# Patient Record
Sex: Female | Born: 1935
Health system: Southern US, Community
[De-identification: ages and names within clinical notes are randomized; demographics above are authoritative.]

## PROBLEM LIST (undated history)

## (undated) DIAGNOSIS — R112 Nausea with vomiting, unspecified: Secondary | ICD-10-CM

## (undated) DIAGNOSIS — Z9889 Other specified postprocedural states: Secondary | ICD-10-CM

## (undated) DIAGNOSIS — E119 Type 2 diabetes mellitus without complications: Secondary | ICD-10-CM

## (undated) DIAGNOSIS — K5903 Drug induced constipation: Secondary | ICD-10-CM

## (undated) DIAGNOSIS — G4700 Insomnia, unspecified: Secondary | ICD-10-CM

## (undated) DIAGNOSIS — N189 Chronic kidney disease, unspecified: Secondary | ICD-10-CM

## (undated) DIAGNOSIS — M419 Scoliosis, unspecified: Secondary | ICD-10-CM

## (undated) DIAGNOSIS — H269 Unspecified cataract: Secondary | ICD-10-CM

## (undated) DIAGNOSIS — Z8489 Family history of other specified conditions: Secondary | ICD-10-CM

## (undated) DIAGNOSIS — Z973 Presence of spectacles and contact lenses: Secondary | ICD-10-CM

## (undated) DIAGNOSIS — T402X5A Adverse effect of other opioids, initial encounter: Secondary | ICD-10-CM

## (undated) DIAGNOSIS — E039 Hypothyroidism, unspecified: Secondary | ICD-10-CM

## (undated) DIAGNOSIS — M1990 Unspecified osteoarthritis, unspecified site: Secondary | ICD-10-CM

## (undated) DIAGNOSIS — N3281 Overactive bladder: Secondary | ICD-10-CM

## (undated) DIAGNOSIS — C801 Malignant (primary) neoplasm, unspecified: Secondary | ICD-10-CM

## (undated) DIAGNOSIS — J45909 Unspecified asthma, uncomplicated: Secondary | ICD-10-CM

## (undated) HISTORY — PX: TUBAL LIGATION: SHX77

## (undated) HISTORY — PX: BREAST SURGERY: SHX581

## (undated) HISTORY — PX: CHOLECYSTECTOMY: SHX55

## (undated) HISTORY — PX: MASTECTOMY: SHX3

## (undated) HISTORY — DX: Unspecified cataract: H26.9

## (undated) HISTORY — PX: ABDOMINAL HYSTERECTOMY: SHX81

## (undated) HISTORY — PX: APPENDECTOMY: SHX54

## (undated) HISTORY — PX: TONSILLECTOMY: SUR1361

## (undated) HISTORY — PX: CATARACT EXTRACTION W/ INTRAOCULAR LENS  IMPLANT, BILATERAL: SHX1307

## (undated) HISTORY — DX: Chronic kidney disease, unspecified: N18.9

## (undated) HISTORY — PX: COLONOSCOPY: SHX174

---

## 2014-04-19 NOTE — Pre-Procedure Instructions (Addendum)
ADDYSYN FERN  04/19/2014   Your procedure is scheduled on:  Thursday, April 21, 2014  Report to [facility] Admitting at 5:30 AM.  Call this number if you have problems the morning of surgery: [phone]   Remember: Patient to bring brace.   Do not eat food or drink liquids after midnight.   Take these medicines the morning of surgery with A SIP OF WATER: gabapentin (NEURONTIN) levothyroxine (SYNTHROID, LEVOTHROID) , tamoxifen (NOLVADEX), if needed: HYDROcodone-acetaminophen (NORCO/VICODIN)  DO NOT TAKE ANY DIABETIC MEDICATIONS THE MORNING OF PROCEDURE such as glipiZIDE (GLUCOTROL)   Stop taking Aspirin, vitamins and herbal medications. Do not take any NSAIDs ie: Ibuprofen, Advil, Naproxen or any medication containing Aspirin; stop now.   Do not wear jewelry, make-up or nail polish.  Do not wear lotions, powders, or perfumes. You may not wear deodorant.  Do not shave 48 hours prior to surgery.   Do not bring valuables to the hospital.  [facility] is not responsible  for any belongings or valuables.               Contacts, dentures or bridgework may not be worn into surgery.  Leave suitcase in the car. After surgery it may be brought to your room.  For patients admitted to the hospital, discharge time is determined by your treatment team.               Patients discharged the day of surgery will not be allowed to drive home.  Name and phone number of your driver:   Special Instructions:  Special Instructions:Special Instructions: [facility] - Preparing for Surgery  Before surgery, you can play an important role.  Because skin is not sterile, your skin needs to be as free of germs as possible.  You can reduce the number of germs on you skin by washing with CHG (chlorahexidine gluconate) soap before surgery.  CHG is an antiseptic cleaner which kills germs and bonds with the skin to continue killing germs even after washing.  Please DO NOT use if you have an allergy to  CHG or antibacterial soaps.  If your skin becomes reddened/irritated stop using the CHG and inform your nurse when you arrive at Short Stay.  Do not shave (including legs and underarms) for at least 48 hours prior to the first CHG shower.  You may shave your face.  Please follow these instructions carefully:   1.  Shower with CHG Soap the night before surgery and the morning of Surgery.  2.  If you choose to wash your hair, wash your hair first as usual with your normal shampoo.  3.  After you shampoo, rinse your hair and body thoroughly to remove the Shampoo.  4.  Use CHG as you would any other liquid soap.  You can apply chg directly  to the skin and wash gently with scrungie or a clean washcloth.  5.  Apply the CHG Soap to your body ONLY FROM THE NECK DOWN.  Do not use on open wounds or open sores.  Avoid contact with your eyes, ears, mouth and genitals (private parts).  Wash genitals (private parts) with your normal soap.  6.  Wash thoroughly, paying special attention to the area where your surgery will be performed.  7.  Thoroughly rinse your body with warm water from the neck down.  8.  DO NOT shower/wash with your normal soap after using and rinsing off the CHG Soap.  9.  Pat yourself dry  with a clean towel.            10.  Wear clean pajamas.            11.  Place clean sheets on your bed the night of your first shower and do not sleep with pets.  Day of Surgery  Do not apply any lotions/deodorants the morning of surgery.  Please wear clean clothes to the hospital/surgery center.   Please read over the following fact sheets that you were given: Pain Booklet, Coughing and Deep Breathing, Blood Transfusion Information, MRSA Information and Surgical Site Infection Prevention

## 2014-04-20 ENCOUNTER — Encounter (HOSPITAL_COMMUNITY)
Admission: RE | Admit: 2014-04-20 | Discharge: 2014-04-20 | Disposition: A | Discharge status: Home / Self Care | Payer: PPO | Source: Ambulatory Visit | Attending: Orthopedic Surgery | Admitting: Orthopedic Surgery

## 2014-04-20 ENCOUNTER — Encounter (HOSPITAL_COMMUNITY): Payer: Self-pay

## 2014-04-20 HISTORY — DX: Insomnia, unspecified: G47.00

## 2014-04-20 HISTORY — DX: Overactive bladder: N32.81

## 2014-04-20 HISTORY — DX: Unspecified osteoarthritis, unspecified site: M19.90

## 2014-04-20 HISTORY — DX: Scoliosis, unspecified: M41.9

## 2014-04-20 HISTORY — DX: Adverse effect of other opioids, initial encounter: T40.2X5A

## 2014-04-20 HISTORY — DX: Drug induced constipation: K59.03

## 2014-04-20 HISTORY — DX: Presence of spectacles and contact lenses: Z97.3

## 2014-04-20 HISTORY — DX: Type 2 diabetes mellitus without complications: E11.9

## 2014-04-20 HISTORY — DX: Family history of other specified conditions: Z84.89

## 2014-04-20 HISTORY — DX: Other specified postprocedural states: Z98.890

## 2014-04-20 HISTORY — DX: Malignant (primary) neoplasm, unspecified: C80.1

## 2014-04-20 HISTORY — DX: Unspecified asthma, uncomplicated: J45.909

## 2014-04-20 HISTORY — DX: Nausea with vomiting, unspecified: R11.2

## 2014-04-20 HISTORY — DX: Hypothyroidism, unspecified: E03.9

## 2014-04-20 LAB — SURGICAL PCR SCREEN
MRSA, PCR: NEGATIVE
Staphylococcus aureus: POSITIVE — AB

## 2014-04-20 LAB — BASIC METABOLIC PANEL
ANION GAP: 8 (ref 5–15)
BUN: 15 mg/dL (ref 6–23)
CALCIUM: 9.7 mg/dL (ref 8.4–10.5)
CO2: 27 mmol/L (ref 19–32)
CREATININE: 0.93 mg/dL (ref 0.50–1.10)
Chloride: 106 mmol/L (ref 96–112)
GFR calc Af Amer: 66 mL/min — ABNORMAL LOW (ref >90)
GFR calc non Af Amer: 57 mL/min — ABNORMAL LOW (ref >90)
Glucose, Bld: 136 mg/dL — ABNORMAL HIGH (ref 70–99)
Potassium: 4 mmol/L (ref 3.5–5.1)
SODIUM: 141 mmol/L (ref 135–145)

## 2014-04-20 LAB — CBC
HCT: 37.2 % (ref 36.0–46.0)
Hemoglobin: 12.3 g/dL (ref 12.0–15.0)
MCH: 30.1 pg (ref 26.0–34.0)
MCHC: 33.1 g/dL (ref 30.0–36.0)
MCV: 91.2 fL (ref 78.0–100.0)
Platelets: 156 10*3/uL (ref 150–400)
RBC: 4.08 MIL/uL (ref 3.87–5.11)
RDW: 13.2 % (ref 11.5–15.5)
WBC: 5.8 10*3/uL (ref 4.0–10.5)

## 2014-04-20 LAB — TYPE AND SCREEN
ABO/RH(D): A NEG
Antibody Screen: NEGATIVE

## 2014-04-20 LAB — ABO/RH: ABO/RH(D): A NEG

## 2014-04-20 MED ORDER — VANCOMYCIN HCL IN DEXTROSE 1-5 GM/200ML-% IV SOLN
1000.0000 mg | INTRAVENOUS | Status: AC
  Administered 2014-04-21: 1000 mg via INTRAVENOUS
  Filled 2014-04-20: qty 200

## 2014-04-20 NOTE — Progress Notes (Signed)
PCR positive for staph. Called pt and notified her of results. Pt's surgery is at 7:30 AM tomorrow, she agrees to start treatment in the AM.

## 2014-04-20 NOTE — Progress Notes (Signed)
Pt denies SOB, chest pain, and being under the care of a cardiologist. Pt denies having an EKG within the last year. Pt denies having a stress test, echo and cardiac cath.

## 2014-04-21 ENCOUNTER — Inpatient Hospital Stay (HOSPITAL_COMMUNITY): Payer: PPO | Admitting: Anesthesiology

## 2014-04-21 ENCOUNTER — Inpatient Hospital Stay (HOSPITAL_COMMUNITY)
Admission: RE | Admit: 2014-04-21 | Discharge: 2014-04-25 | DRG: 455 | Disposition: A | Discharge status: Home Health | Payer: PPO | Source: Ambulatory Visit | Attending: Orthopedic Surgery | Admitting: Orthopedic Surgery

## 2014-04-21 ENCOUNTER — Encounter (HOSPITAL_COMMUNITY): Payer: Self-pay | Admitting: *Deleted

## 2014-04-21 ENCOUNTER — Encounter (HOSPITAL_COMMUNITY)
Admission: RE | Disposition: A | Discharge status: Home Health | Payer: PPO | Source: Ambulatory Visit | Attending: Orthopedic Surgery

## 2014-04-21 ENCOUNTER — Inpatient Hospital Stay (HOSPITAL_COMMUNITY): Payer: PPO

## 2014-04-21 DIAGNOSIS — E119 Type 2 diabetes mellitus without complications: Secondary | ICD-10-CM | POA: Diagnosis present

## 2014-04-21 DIAGNOSIS — M549 Dorsalgia, unspecified: Secondary | ICD-10-CM | POA: Diagnosis present

## 2014-04-21 DIAGNOSIS — N3020 Other chronic cystitis without hematuria: Secondary | ICD-10-CM | POA: Diagnosis present

## 2014-04-21 DIAGNOSIS — E039 Hypothyroidism, unspecified: Secondary | ICD-10-CM | POA: Diagnosis present

## 2014-04-21 DIAGNOSIS — M4806 Spinal stenosis, lumbar region: Secondary | ICD-10-CM | POA: Diagnosis present

## 2014-04-21 DIAGNOSIS — M4180 Other forms of scoliosis, site unspecified: Secondary | ICD-10-CM | POA: Diagnosis present

## 2014-04-21 DIAGNOSIS — M5489 Other dorsalgia: Secondary | ICD-10-CM | POA: Diagnosis present

## 2014-04-21 DIAGNOSIS — Z981 Arthrodesis status: Secondary | ICD-10-CM

## 2014-04-21 DIAGNOSIS — M810 Age-related osteoporosis without current pathological fracture: Secondary | ICD-10-CM | POA: Diagnosis present

## 2014-04-21 DIAGNOSIS — Z419 Encounter for procedure for purposes other than remedying health state, unspecified: Secondary | ICD-10-CM

## 2014-04-21 HISTORY — PX: ANTERIOR LAT LUMBAR FUSION: SHX1168

## 2014-04-21 LAB — GLUCOSE, CAPILLARY
GLUCOSE-CAPILLARY: 113 mg/dL — AB (ref 70–99)
Glucose-Capillary: 219 mg/dL — ABNORMAL HIGH (ref 70–99)
Glucose-Capillary: 261 mg/dL — ABNORMAL HIGH (ref 70–99)
Glucose-Capillary: 220 mg/dL — ABNORMAL HIGH (ref 70–99)
Glucose-Capillary: 230 mg/dL — ABNORMAL HIGH (ref 70–99)

## 2014-04-21 SURGERY — ANTERIOR LATERAL LUMBAR FUSION 1 LEVEL
Anesthesia: General | Site: Back

## 2014-04-21 MED ORDER — MORPHINE SULFATE 2 MG/ML IJ SOLN
INTRAMUSCULAR | Status: AC
  Administered 2014-04-21: 2 mg via INTRAVENOUS
  Filled 2014-04-21: qty 1

## 2014-04-21 MED ORDER — MUPIROCIN 2 % EX OINT
1.0000 "application " | TOPICAL_OINTMENT | Freq: Two times a day (BID) | CUTANEOUS | Status: DC
  Administered 2014-04-21: 1 via TOPICAL

## 2014-04-21 MED ORDER — MIDAZOLAM HCL 5 MG/5ML IJ SOLN
INTRAMUSCULAR | Status: DC | PRN
  Administered 2014-04-21: 2 mg via INTRAVENOUS

## 2014-04-21 MED ORDER — OXYCODONE HCL 5 MG PO TABS
10.0000 mg | ORAL_TABLET | ORAL | Status: DC | PRN
  Administered 2014-04-21 – 2014-04-25 (×11): 10 mg via ORAL
  Filled 2014-04-21 (×11): qty 2

## 2014-04-21 MED ORDER — OXYCODONE HCL 5 MG PO TABS: 5.0000 mg | ORAL_TABLET | Freq: Once | ORAL | Status: DC | PRN

## 2014-04-21 MED ORDER — FENTANYL CITRATE 0.05 MG/ML IJ SOLN
25.0000 ug | INTRAMUSCULAR | Status: DC | PRN
  Administered 2014-04-21 (×4): 25 ug via INTRAVENOUS

## 2014-04-21 MED ORDER — ONDANSETRON HCL 4 MG/2ML IJ SOLN
INTRAMUSCULAR | Status: DC | PRN
  Administered 2014-04-21: 4 mg via INTRAVENOUS

## 2014-04-21 MED ORDER — PHENOL 1.4 % MT LIQD: 1.0000 | OROMUCOSAL | Status: DC | PRN

## 2014-04-21 MED ORDER — EPHEDRINE SULFATE 50 MG/ML IJ SOLN
INTRAMUSCULAR | Status: DC | PRN
  Administered 2014-04-21 (×2): 10 mg via INTRAVENOUS
  Administered 2014-04-21: 20 mg via INTRAVENOUS

## 2014-04-21 MED ORDER — THROMBIN 20000 UNITS EX KIT
PACK | CUTANEOUS | Status: DC | PRN
  Administered 2014-04-21: 20000 [IU] via TOPICAL

## 2014-04-21 MED ORDER — SODIUM CHLORIDE 0.9 % IJ SOLN
INTRAMUSCULAR | Status: AC
  Filled 2014-04-21: qty 10

## 2014-04-21 MED ORDER — DIPHENHYDRAMINE HCL 50 MG/ML IJ SOLN
INTRAMUSCULAR | Status: DC | PRN
  Administered 2014-04-21: 12.5 mg via INTRAVENOUS

## 2014-04-21 MED ORDER — METHOCARBAMOL 500 MG PO TABS
500.0000 mg | ORAL_TABLET | Freq: Four times a day (QID) | ORAL | Status: DC | PRN
  Administered 2014-04-21 – 2014-04-22 (×3): 500 mg via ORAL
  Filled 2014-04-21 (×3): qty 1

## 2014-04-21 MED ORDER — OXYBUTYNIN CHLORIDE 5 MG PO TABS
5.0000 mg | ORAL_TABLET | Freq: Every day | ORAL | Status: DC
  Administered 2014-04-22 – 2014-04-25 (×4): 5 mg via ORAL
  Filled 2014-04-21 (×4): qty 1

## 2014-04-21 MED ORDER — PROPOFOL 10 MG/ML IV BOLUS
INTRAVENOUS | Status: DC | PRN
  Administered 2014-04-21: 150 mg via INTRAVENOUS

## 2014-04-21 MED ORDER — FENTANYL CITRATE 0.05 MG/ML IJ SOLN
INTRAMUSCULAR | Status: DC | PRN
  Administered 2014-04-21: 50 ug via INTRAVENOUS
  Administered 2014-04-21: 100 ug via INTRAVENOUS
  Administered 2014-04-21 (×2): 50 ug via INTRAVENOUS

## 2014-04-21 MED ORDER — SODIUM CHLORIDE 0.9 % IJ SOLN: 3.0000 mL | INTRAMUSCULAR | Status: DC | PRN

## 2014-04-21 MED ORDER — METHOCARBAMOL 1000 MG/10ML IJ SOLN
500.0000 mg | INTRAVENOUS | Status: AC
  Administered 2014-04-21: 500 mg via INTRAVENOUS
  Filled 2014-04-21: qty 5

## 2014-04-21 MED ORDER — PHENYLEPHRINE 40 MCG/ML (10ML) SYRINGE FOR IV PUSH (FOR BLOOD PRESSURE SUPPORT)
PREFILLED_SYRINGE | INTRAVENOUS | Status: AC
  Filled 2014-04-21: qty 10

## 2014-04-21 MED ORDER — DIPHENHYDRAMINE HCL 50 MG/ML IJ SOLN
INTRAMUSCULAR | Status: AC
  Filled 2014-04-21: qty 1

## 2014-04-21 MED ORDER — LIDOCAINE HCL (CARDIAC) 20 MG/ML IV SOLN
INTRAVENOUS | Status: AC
  Filled 2014-04-21: qty 5

## 2014-04-21 MED ORDER — VANCOMYCIN HCL IN DEXTROSE 1-5 GM/200ML-% IV SOLN
1000.0000 mg | Freq: Once | INTRAVENOUS | Status: AC
  Administered 2014-04-21: 1000 mg via INTRAVENOUS
  Filled 2014-04-21: qty 200

## 2014-04-21 MED ORDER — PROPOFOL 10 MG/ML IV BOLUS
INTRAVENOUS | Status: AC
  Filled 2014-04-21: qty 20

## 2014-04-21 MED ORDER — DEXAMETHASONE 4 MG PO TABS
4.0000 mg | ORAL_TABLET | Freq: Four times a day (QID) | ORAL | Status: DC
  Administered 2014-04-21 – 2014-04-25 (×8): 4 mg via ORAL
  Filled 2014-04-21 (×11): qty 1

## 2014-04-21 MED ORDER — FENTANYL CITRATE 0.05 MG/ML IJ SOLN
INTRAMUSCULAR | Status: AC
  Filled 2014-04-21: qty 2

## 2014-04-21 MED ORDER — ROCURONIUM BROMIDE 100 MG/10ML IV SOLN
INTRAVENOUS | Status: DC | PRN
  Administered 2014-04-21: 30 mg via INTRAVENOUS
  Administered 2014-04-21: 10 mg via INTRAVENOUS

## 2014-04-21 MED ORDER — ONDANSETRON HCL 4 MG/2ML IJ SOLN: 4.0000 mg | Freq: Once | INTRAMUSCULAR | Status: DC | PRN

## 2014-04-21 MED ORDER — ROCURONIUM BROMIDE 50 MG/5ML IV SOLN
INTRAVENOUS | Status: AC
  Filled 2014-04-21: qty 1

## 2014-04-21 MED ORDER — LIDOCAINE HCL (CARDIAC) 20 MG/ML IV SOLN
INTRAVENOUS | Status: DC | PRN
  Administered 2014-04-21: 40 mg via INTRAVENOUS

## 2014-04-21 MED ORDER — HEMOSTATIC AGENTS (NO CHARGE) OPTIME
TOPICAL | Status: DC | PRN
  Administered 2014-04-21 (×2): 1 via TOPICAL

## 2014-04-21 MED ORDER — ACETAMINOPHEN 10 MG/ML IV SOLN
1000.0000 mg | Freq: Four times a day (QID) | INTRAVENOUS | Status: AC
  Administered 2014-04-21 – 2014-04-22 (×4): 1000 mg via INTRAVENOUS
  Filled 2014-04-21 (×4): qty 100

## 2014-04-21 MED ORDER — THROMBIN 20000 UNITS EX SOLR
CUTANEOUS | Status: AC
  Filled 2014-04-21: qty 20000

## 2014-04-21 MED ORDER — PHENYLEPHRINE HCL 10 MG/ML IJ SOLN
INTRAMUSCULAR | Status: AC
  Filled 2014-04-21: qty 1

## 2014-04-21 MED ORDER — LACTATED RINGERS IV SOLN
INTRAVENOUS | Status: DC
Start: 2014-04-21 — End: 2014-04-25
  Administered 2014-04-21: 21:00:00 via INTRAVENOUS

## 2014-04-21 MED ORDER — OXYCODONE HCL 5 MG/5ML PO SOLN: 5.0000 mg | Freq: Once | ORAL | Status: DC | PRN

## 2014-04-21 MED ORDER — SODIUM CHLORIDE 0.9 % IV SOLN: 250.0000 mL | INTRAVENOUS | Status: DC

## 2014-04-21 MED ORDER — ACETAMINOPHEN 10 MG/ML IV SOLN
1000.0000 mg | INTRAVENOUS | Status: AC
  Administered 2014-04-21: 1000 mg via INTRAVENOUS
  Filled 2014-04-21: qty 100

## 2014-04-21 MED ORDER — GLIPIZIDE 5 MG PO TABS
5.0000 mg | ORAL_TABLET | Freq: Every day | ORAL | Status: DC
  Administered 2014-04-22 – 2014-04-25 (×4): 5 mg via ORAL
  Filled 2014-04-21 (×4): qty 1

## 2014-04-21 MED ORDER — EPHEDRINE SULFATE 50 MG/ML IJ SOLN
INTRAMUSCULAR | Status: AC
  Filled 2014-04-21: qty 1

## 2014-04-21 MED ORDER — INSULIN ASPART 100 UNIT/ML ~~LOC~~ SOLN
0.0000 [IU] | SUBCUTANEOUS | Status: DC
  Administered 2014-04-21 (×2): 5 [IU] via SUBCUTANEOUS
  Administered 2014-04-21: 8 [IU] via SUBCUTANEOUS
  Administered 2014-04-22: 5 [IU] via SUBCUTANEOUS
  Administered 2014-04-22 (×3): 3 [IU] via SUBCUTANEOUS
  Administered 2014-04-23 (×2): 5 [IU] via SUBCUTANEOUS
  Administered 2014-04-23 (×2): 3 [IU] via SUBCUTANEOUS
  Administered 2014-04-23: 5 [IU] via SUBCUTANEOUS
  Administered 2014-04-23 – 2014-04-24 (×3): 3 [IU] via SUBCUTANEOUS
  Administered 2014-04-24: 5 [IU] via SUBCUTANEOUS
  Administered 2014-04-24: 3 [IU] via SUBCUTANEOUS
  Administered 2014-04-24: 8 [IU] via SUBCUTANEOUS
  Administered 2014-04-24 – 2014-04-25 (×2): 5 [IU] via SUBCUTANEOUS
  Administered 2014-04-25: 8 [IU] via SUBCUTANEOUS
  Administered 2014-04-25 (×2): 5 [IU] via SUBCUTANEOUS

## 2014-04-21 MED ORDER — DEXAMETHASONE SODIUM PHOSPHATE 4 MG/ML IJ SOLN
4.0000 mg | Freq: Four times a day (QID) | INTRAMUSCULAR | Status: DC
  Administered 2014-04-21 – 2014-04-25 (×5): 4 mg via INTRAVENOUS
  Filled 2014-04-21 (×4): qty 1

## 2014-04-21 MED ORDER — DEXAMETHASONE SODIUM PHOSPHATE 4 MG/ML IJ SOLN
INTRAMUSCULAR | Status: DC | PRN
  Administered 2014-04-21: 4 mg via INTRAVENOUS

## 2014-04-21 MED ORDER — METHOCARBAMOL 1000 MG/10ML IJ SOLN
500.0000 mg | Freq: Four times a day (QID) | INTRAMUSCULAR | Status: DC | PRN
  Administered 2014-04-21: 500 mg via INTRAVENOUS
  Filled 2014-04-21: qty 5

## 2014-04-21 MED ORDER — METOCLOPRAMIDE HCL 5 MG/ML IJ SOLN
INTRAMUSCULAR | Status: DC | PRN
  Administered 2014-04-21: 10 mg via INTRAVENOUS

## 2014-04-21 MED ORDER — SUCCINYLCHOLINE CHLORIDE 20 MG/ML IJ SOLN
INTRAMUSCULAR | Status: DC | PRN
  Administered 2014-04-21: 120 mg via INTRAVENOUS

## 2014-04-21 MED ORDER — LEVOTHYROXINE SODIUM 50 MCG PO TABS
50.0000 ug | ORAL_TABLET | Freq: Every day | ORAL | Status: DC
  Administered 2014-04-22 – 2014-04-25 (×4): 50 ug via ORAL
  Filled 2014-04-21 (×4): qty 1

## 2014-04-21 MED ORDER — PROPOFOL INFUSION 10 MG/ML OPTIME
INTRAVENOUS | Status: DC | PRN
  Administered 2014-04-21: 50 ug/kg/min via INTRAVENOUS

## 2014-04-21 MED ORDER — PROPOFOL 10 MG/ML IV EMUL
INTRAVENOUS | Status: AC
  Filled 2014-04-21: qty 100

## 2014-04-21 MED ORDER — MUPIROCIN 2 % EX OINT
TOPICAL_OINTMENT | CUTANEOUS | Status: AC
Start: 2014-04-21 — End: 2014-04-21
  Administered 2014-04-21: 1 via TOPICAL
  Filled 2014-04-21: qty 22

## 2014-04-21 MED ORDER — SUCCINYLCHOLINE CHLORIDE 20 MG/ML IJ SOLN
INTRAMUSCULAR | Status: AC
  Filled 2014-04-21: qty 1

## 2014-04-21 MED ORDER — PHENYLEPHRINE HCL 10 MG/ML IJ SOLN
10.0000 mg | INTRAVENOUS | Status: DC | PRN
  Administered 2014-04-21: 25 ug/min via INTRAVENOUS

## 2014-04-21 MED ORDER — BUPIVACAINE-EPINEPHRINE (PF) 0.25% -1:200000 IJ SOLN
INTRAMUSCULAR | Status: AC
  Filled 2014-04-21: qty 30

## 2014-04-21 MED ORDER — SODIUM CHLORIDE 0.9 % IJ SOLN
3.0000 mL | Freq: Two times a day (BID) | INTRAMUSCULAR | Status: DC
  Administered 2014-04-22 – 2014-04-25 (×6): 3 mL via INTRAVENOUS

## 2014-04-21 MED ORDER — MORPHINE SULFATE 2 MG/ML IJ SOLN
1.0000 mg | INTRAMUSCULAR | Status: DC | PRN
  Administered 2014-04-21 (×2): 2 mg via INTRAVENOUS
  Filled 2014-04-21: qty 1

## 2014-04-21 MED ORDER — ONDANSETRON HCL 4 MG/2ML IJ SOLN: 4.0000 mg | INTRAMUSCULAR | Status: DC | PRN

## 2014-04-21 MED ORDER — 0.9 % SODIUM CHLORIDE (POUR BTL) OPTIME
TOPICAL | Status: DC | PRN
  Administered 2014-04-21 (×2): 1000 mL

## 2014-04-21 MED ORDER — GABAPENTIN 300 MG PO CAPS
300.0000 mg | ORAL_CAPSULE | Freq: Three times a day (TID) | ORAL | Status: DC
  Administered 2014-04-21 – 2014-04-22 (×2): 300 mg via ORAL
  Administered 2014-04-22 – 2014-04-23 (×5): 600 mg via ORAL
  Administered 2014-04-24 – 2014-04-25 (×4): 300 mg via ORAL
  Filled 2014-04-21: qty 1
  Filled 2014-04-21: qty 2
  Filled 2014-04-21 (×5): qty 1
  Filled 2014-04-21 (×2): qty 2
  Filled 2014-04-21: qty 1
  Filled 2014-04-21: qty 2
  Filled 2014-04-21: qty 1

## 2014-04-21 MED ORDER — METOCLOPRAMIDE HCL 5 MG/ML IJ SOLN
INTRAMUSCULAR | Status: AC
  Filled 2014-04-21: qty 2

## 2014-04-21 MED ORDER — MIDAZOLAM HCL 2 MG/2ML IJ SOLN
INTRAMUSCULAR | Status: AC
  Filled 2014-04-21: qty 2

## 2014-04-21 MED ORDER — LACTATED RINGERS IV SOLN
INTRAVENOUS | Status: DC | PRN
  Administered 2014-04-21 (×3): via INTRAVENOUS

## 2014-04-21 MED ORDER — PHENYLEPHRINE HCL 10 MG/ML IJ SOLN
INTRAMUSCULAR | Status: DC | PRN
  Administered 2014-04-21 (×4): 80 ug via INTRAVENOUS

## 2014-04-21 MED ORDER — MENTHOL 3 MG MT LOZG: 1.0000 | LOZENGE | OROMUCOSAL | Status: DC | PRN

## 2014-04-21 MED ORDER — LISINOPRIL 10 MG PO TABS
10.0000 mg | ORAL_TABLET | Freq: Every day | ORAL | Status: DC
  Administered 2014-04-22 – 2014-04-25 (×4): 10 mg via ORAL
  Filled 2014-04-21 (×4): qty 1

## 2014-04-21 MED ORDER — FENTANYL CITRATE 0.05 MG/ML IJ SOLN
INTRAMUSCULAR | Status: AC
  Filled 2014-04-21: qty 5

## 2014-04-21 SURGICAL SUPPLY — 75 items
BLADE SURG 10 STRL SS (BLADE) ×3 IMPLANT
BLADE SURG ROTATE 9660 (MISCELLANEOUS) IMPLANT
BOLT 5.0X45 SPINAL (Bolt) ×3 IMPLANT
BOLT 5.0X50 SPINAL (Bolt) ×3 IMPLANT
BOLT SPNL LRG 45X5.5XPLAT NS (Screw) ×2 IMPLANT
BONE MATRIX OSTEOCEL PRO MED (Bone Implant) ×6 IMPLANT
CAGE COROENT XL 12X22X45 (Cage) ×2 IMPLANT
CAGE COROENT XL 12X22X45MM (Cage) ×1 IMPLANT
CLOSURE WOUND 1/2 X4 (GAUZE/BANDAGES/DRESSINGS)
CORDS BIPOLAR (ELECTRODE) ×6 IMPLANT
COVER SURGICAL LIGHT HANDLE (MISCELLANEOUS) ×6 IMPLANT
DRAPE C-ARM 42X72 X-RAY (DRAPES) ×6 IMPLANT
DRAPE C-ARMOR (DRAPES) ×6 IMPLANT
DRAPE INCISE IOBAN 66X45 STRL (DRAPES) ×3 IMPLANT
DRAPE ORTHO SPLIT 77X108 STRL (DRAPES) ×4
DRAPE POUCH INSTRU U-SHP 10X18 (DRAPES) ×3 IMPLANT
DRAPE SURG ORHT 6 SPLT 77X108 (DRAPES) ×2 IMPLANT
DRAPE U-SHAPE 47X51 STRL (DRAPES) ×6 IMPLANT
DRSG MEPILEX BORDER 4X4 (GAUZE/BANDAGES/DRESSINGS) ×6 IMPLANT
DRSG MEPILEX BORDER 4X8 (GAUZE/BANDAGES/DRESSINGS) ×6 IMPLANT
DURAPREP 26ML APPLICATOR (WOUND CARE) ×9 IMPLANT
ELECT BLADE 4.0 EZ CLEAN MEGAD (MISCELLANEOUS) ×6
ELECT PENCIL ROCKER SW 15FT (MISCELLANEOUS) ×6 IMPLANT
ELECT REM PT RETURN 9FT ADLT (ELECTROSURGICAL) ×6
ELECTRODE BLDE 4.0 EZ CLN MEGD (MISCELLANEOUS) ×2 IMPLANT
ELECTRODE REM PT RTRN 9FT ADLT (ELECTROSURGICAL) ×2 IMPLANT
GAUZE SPONGE 4X4 16PLY XRAY LF (GAUZE/BANDAGES/DRESSINGS) ×3 IMPLANT
GLOVE BIOGEL PI IND STRL 8 (GLOVE) ×2 IMPLANT
GLOVE BIOGEL PI IND STRL 8.5 (GLOVE) ×2 IMPLANT
GLOVE BIOGEL PI INDICATOR 8 (GLOVE) ×4
GLOVE BIOGEL PI INDICATOR 8.5 (GLOVE) ×4
GLOVE ORTHO TXT STRL SZ7.5 (GLOVE) ×6 IMPLANT
GLOVE SS BIOGEL STRL SZ 8.5 (GLOVE) ×2 IMPLANT
GLOVE SUPERSENSE BIOGEL SZ 8.5 (GLOVE) ×4
GOWN STRL REUS W/ TWL XL LVL3 (GOWN DISPOSABLE) ×2 IMPLANT
GOWN STRL REUS W/TWL 2XL LVL3 (GOWN DISPOSABLE) ×6 IMPLANT
GOWN STRL REUS W/TWL XL LVL3 (GOWN DISPOSABLE) ×4
KIT BASIN OR (CUSTOM PROCEDURE TRAY) ×3 IMPLANT
KIT DILATOR XLIF 5 (KITS) ×1 IMPLANT
KIT NEEDLE NVM5 EMG ELECT (KITS) ×1 IMPLANT
KIT NEEDLE NVM5 EMG ELECTRODE (KITS) ×2
KIT ROOM TURNOVER OR (KITS) ×3 IMPLANT
KIT SURGICAL ACCESS MAXCESS 4 (KITS) ×3 IMPLANT
KIT XLIF (KITS) ×2
MIX DBX 10CC 35% BONE (Bone Implant) ×3 IMPLANT
NEEDLE 22X1 1/2 (OR ONLY) (NEEDLE) ×3 IMPLANT
NEEDLE SPNL 18GX3.5 QUINCKE PK (NEEDLE) ×3 IMPLANT
NS IRRIG 1000ML POUR BTL (IV SOLUTION) ×3 IMPLANT
PACK LAMINECTOMY ORTHO (CUSTOM PROCEDURE TRAY) ×3 IMPLANT
PACK UNIVERSAL I (CUSTOM PROCEDURE TRAY) ×6 IMPLANT
PAD ARMBOARD 7.5X6 YLW CONV (MISCELLANEOUS) ×6 IMPLANT
PIN FIXATION XLIF DECADE (PIN) ×9 IMPLANT
PLATE DECADE XLIF 4HOLE SZ14 (Plate) ×3 IMPLANT
SCREW 45MM (Screw) ×4 IMPLANT
SCREW DECADE 5.5X50 (Screw) ×3 IMPLANT
SPONGE LAP 4X18 X RAY DECT (DISPOSABLE) ×18 IMPLANT
SPONGE SURGIFOAM ABS GEL 100 (HEMOSTASIS) ×3 IMPLANT
STAPLER VISISTAT 35W (STAPLE) ×3 IMPLANT
STRIP CLOSURE SKIN 1/2X4 (GAUZE/BANDAGES/DRESSINGS) IMPLANT
SURGIFLO TRUKIT (HEMOSTASIS) IMPLANT
SUT BONE WAX W31G (SUTURE) ×3 IMPLANT
SUT MON AB 3-0 SH 27 (SUTURE) ×4
SUT MON AB 3-0 SH27 (SUTURE) ×2 IMPLANT
SUT VIC AB 1 CT1 27 (SUTURE) ×4
SUT VIC AB 1 CT1 27XBRD ANBCTR (SUTURE) ×2 IMPLANT
SUT VIC AB 1 CTX 36 (SUTURE) ×4
SUT VIC AB 1 CTX36XBRD ANBCTR (SUTURE) ×2 IMPLANT
SUT VIC AB 2-0 CT1 18 (SUTURE) ×6 IMPLANT
SYR BULB IRRIGATION 50ML (SYRINGE) ×6 IMPLANT
SYR CONTROL 10ML LL (SYRINGE) ×3 IMPLANT
TAPE CLOTH 4X10 WHT NS (GAUZE/BANDAGES/DRESSINGS) ×6 IMPLANT
TOWEL OR 17X24 6PK STRL BLUE (TOWEL DISPOSABLE) ×6 IMPLANT
TOWEL OR 17X26 10 PK STRL BLUE (TOWEL DISPOSABLE) ×6 IMPLANT
TRAY FOLEY CATH 14FR (SET/KITS/TRAYS/PACK) ×3 IMPLANT
WATER STERILE IRR 1000ML POUR (IV SOLUTION) ×6 IMPLANT

## 2014-04-21 NOTE — Transfer of Care (Signed)
Immediate Anesthesia Transfer of Care Note  Patient: Vanessa Atkins  Procedure(s) Performed: Procedure(s): XLIF L2 - L3 WITH LATERAL PLATES/POSTERIOR DECOMPRESSION L2 - L3 FUSION  (N/A)  Patient Location: PACU  Anesthesia Type:General  Level of Consciousness: awake  Airway & Oxygen Therapy: Patient Spontanous Breathing and Patient connected to nasal cannula oxygen  Post-op Assessment: Report given to RN and Post -op Vital signs reviewed and stable  Post vital signs: Reviewed and stable  Last Vitals:  Filed Vitals:   04/21/14 0632  BP: 133/73  Pulse: 65  Temp: 36.7 C  Resp: 18    Complications: No apparent anesthesia complications

## 2014-04-21 NOTE — Anesthesia Preprocedure Evaluation (Signed)
Anesthesia Evaluation  Patient identified by MRN, date of birth, ID band Patient awake    Reviewed: Allergy & Precautions, NPO status , Patient's Chart, lab work & pertinent test results  Airway Mallampati: II  TM Distance: >3 FB Neck ROM: Full    Dental  (+) Teeth Intact, Dental Advisory Given   Pulmonary  breath sounds clear to auscultation        Cardiovascular Rhythm:Regular Rate:Normal     Neuro/Psych    GI/Hepatic   Endo/Other  diabetes  Renal/GU      Musculoskeletal   Abdominal   Peds  Hematology   Anesthesia Other Findings   Reproductive/Obstetrics                             Anesthesia Physical Anesthesia Plan  ASA: III  Anesthesia Plan: General   Post-op Pain Management:    Induction: Intravenous  Airway Management Planned: Oral ETT  Additional Equipment:   Intra-op Plan:   Post-operative Plan: Extubation in OR  Informed Consent: I have reviewed the patients History and Physical, chart, labs and discussed the procedure including the risks, benefits and alternatives for the proposed anesthesia with the patient or authorized representative who has indicated his/her understanding and acceptance.   Dental advisory given  Plan Discussed with: CRNA and Anesthesiologist  Anesthesia Plan Comments: (Type 2 DM H/O R. Breast Ca Obesity  Plan GA with oral ETT  Roberts Gaudy, MD)        Anesthesia Quick Evaluation

## 2014-04-21 NOTE — Anesthesia Postprocedure Evaluation (Signed)
Anesthesia Post Note  Patient: Vanessa Atkins  Procedure(s) Performed: Procedure(s) (LRB): XLIF L2 - L3 WITH LATERAL PLATES/POSTERIOR DECOMPRESSION L2 - L3 FUSION  (N/A)  Anesthesia type: general  Patient location: PACU  Post pain: Pain level controlled  Post assessment: Patient's Cardiovascular Status Stable  Last Vitals:  Filed Vitals:   04/21/14 1600  BP: 123/53  Pulse: 84  Temp: 35.8 C  Resp: 20    Post vital signs: Reviewed and stable  Level of consciousness: sedated  Complications: No apparent anesthesia complications

## 2014-04-21 NOTE — Brief Op Note (Signed)
04/21/2014  1:33 PM  PATIENT:  Emilio Math  80 y.o. female  PRE-OPERATIVE DIAGNOSIS:  degenerative scelosis  POST-OPERATIVE DIAGNOSIS:  degenerative scelosis  PROCEDURE:  Procedure(s): XLIF L2 - L3 WITH LATERAL PLATES/POSTERIOR DECOMPRESSION L2 - L3 FUSION  (N/A)  SURGEON:  Surgeon(s) and Role:    * Melina Schools, MD - Primary  PHYSICIAN ASSISTANT:   ASSISTANTS: none   ANESTHESIA:   general  EBL:  Total I/O In: 2000 [I.V.:2000] Out: 850 [Urine:850]  BLOOD ADMINISTERED:none  DRAINS: none   LOCAL MEDICATIONS USED:  MARCAINE     SPECIMEN:  No Specimen  DISPOSITION OF SPECIMEN:  N/A  COUNTS:  YES  TOURNIQUET:  * No tourniquets in log *  DICTATION: .Other Dictation: Dictation Number [id number]  PLAN OF CARE: Admit to inpatient   PATIENT DISPOSITION:  PACU - hemodynamically stable.

## 2014-04-21 NOTE — Progress Notes (Signed)
ANTIBIOTIC CONSULT NOTE - INITIAL  Pharmacy Consult:  Vancomycin Indication:  Surgical prophylaxis  Allergies  Allergen Reactions  . Procaine Palpitations    Cold sweat  . Clarithromycin     Other reaction(s): Unknown Can't remember reaction  . Ivp Dye [Iodinated Diagnostic Agents]     Other reaction(s): Other Heart races, chills, Fever Palpitations, sweating  . Lactose Intolerance (Gi) Other (See Comments) and Diarrhea    Other reaction(s): Other Gi upset  . Tetanus Toxoids Itching, Swelling and Rash    Other reaction(s): Other Fever, "Tetanus Vaccine"   . Ciprofloxacin Other (See Comments)    Caused pt to faint; cannot take anything in the Cipro family.  . Metronidazole Diarrhea  . Omeprazole Diarrhea  . Tape Other (See Comments)    Rash with adhesive tape; can use paper tape    Patient Measurements: Weight: 208 lb 6 oz (94.518 kg)  Vital Signs: Temp: 97.6 F (36.4 C) (03/31 1535) Temp Source: Oral (03/31 3419) BP: 126/58 mmHg (03/31 1509) Pulse Rate: 87 (03/31 1509) Intake/Output from this shift: Total I/O In: 2350 [I.V.:2350] Out: 1100 [Urine:1000; Blood:100]  Labs:  Recent Labs  04/20/14 1423  WBC 5.8  HGB 12.3  PLT 156  CREATININE 0.93   Estimated Creatinine Clearance: 58.9 mL/min (by C-G formula based on Cr of 0.93). No results for input(s): VANCOTROUGH, VANCOPEAK, VANCORANDOM, GENTTROUGH, GENTPEAK, GENTRANDOM, TOBRATROUGH, TOBRAPEAK, TOBRARND, AMIKACINPEAK, AMIKACINTROU, AMIKACIN in the last 72 hours.   Microbiology: Recent Results (from the past 720 hour(s))  Surgical pcr screen     Status: Abnormal   Collection Time: 04/20/14  2:47 PM  Result Value Ref Range Status   MRSA, PCR NEGATIVE NEGATIVE Final   Staphylococcus aureus POSITIVE (A) NEGATIVE Final    Comment:        The Xpert SA Assay (FDA approved for NASAL specimens in patients over 78 years of age), is one component of a comprehensive surveillance program.  Test performance  has been validated by [facility] for patients greater than or equal to 4 year old. It is not intended to diagnose infection nor to guide or monitor treatment.     Medical History: Past Medical History  Diagnosis Date  . PONV (postoperative nausea and vomiting)   . Family history of adverse reaction to anesthesia     sister PONV  . Insomnia   . Diabetes mellitus without complication   . Hypothyroidism   . Overactive bladder   . Cancer     breast and uterine  . Wears glasses   . Constipation due to opioid therapy   . Bronchial asthma     last bout around 2009  . Arthritis   . Scoliosis     degenerative scoliosis      Assessment: 80 YOF s/p spinal surgery to receive vancomycin x 1 dose post-op for surgical prophylaxis.  Patient does not have a drain and received the pre-op dose around 0730 today.  Pre-op labs reviewed.   Goal of Therapy:  Surgical prophylaxis   Plan:  - Vanc 1gm IV x 1 at [place] will sign off.  Thank you for the consult.    Shealyn Sean D. Mina Marble, PharmD, BCPS Pager:  [phone] 04/21/2014, 4:19 PM

## 2014-04-21 NOTE — H&P (Signed)
History of Present Illness  The patient is a 80 year old female who comes in today for a preoperative History and Physical. The patient is scheduled for a XLIF L2-3 to be performed by Dr. Duane Lope D. Rolena Infante, MD at [facility] on 04-21-14 . Please see the hospital record for complete dictated history and physical.  Allergies  Iodinated Contrast Irregular heart rate. Adhesive Tape Tetanus Toxoid Adsorbed *TOXOIDS* Swelling, Rash. Cipro *FLUOROQUINOLONES* Dizziness.  Social History  Never consumed alcohol 03/15/2014: Never consumed alcohol Marital status married No history of drug/alcohol rehab Number of flights of stairs before winded less than 1 Not under pain contract Living situation live with spouse Tobacco / smoke exposure 03/15/2014: no Tobacco use Never smoker. 03/15/2014 Children 4 Exercise Exercises never; does other Current work status retired  Medication History  Tamoxifen Citrate (20MG  Tablet, Oral) Active. (qd) Lisinopril (10MG  Tablet, Oral) Active. (qd) Oxybutynin Chloride (5MG  Tablet, Oral) Active. (qd) Levothyroxine Sodium (50MCG Tablet, Oral) Active. (qd) Gabapentin (300MG  Tablet, Oral) Active. (1 up to tid of the 300mg  THEN 600mg  qhs) Xanax (2MG  Tablet, Oral) Active. (qhs) Hydrocodone-Acetaminophen (5-325MG  Tablet, Oral) Active. (1-2 q 8hrs) GlipiZIDE ER (5MG  Tablet ER 24HR, Oral) Active. (qd) Medications Reconciled  Past Surgical History  Hysterectomy complete (cancerous) Gallbladder Surgery laporoscopic Rotator Cuff Repair right Tubal Ligation Tonsillectomy Arthroscopy of Knee right Appendectomy Breast Mass; Local Excision right Dilation and Curettage of Uterus - Multiple Cataract Surgery bilateral  Other Problems Osteoporosis Other disease, cancer, significant illness Hypothyroidism Chronic Cystitis Diabetes Mellitus, Type II  Vitals (Robin C. Young; 04/19/2014 4:15 PM) 04/19/2014 4:10 PM Weight: 208.03  lb Height: 67in Body Surface Area: 2.06 m Body Mass Index: 32.58 kg/m  Temp.: 98.52F(Oral)  Pulse: 75 (Regular)  BP: 115/60 (Sitting, Left Arm, Standard)   Physical Exam) General Mental Status -Alert and cooperative.  Chest and Lung Exam Examination of related systems reveals-well-developed, well-nourished and in no acute distress; alert and oriented x 3. Chest and lung exam reveals -non-tender and normal tactile fremitus.  Cardiovascular Cardiovascular examination reveals -on palpation PMI is normal in location and amplitude, no palpable S3 or S4. Normal cardiac borders. and normal pedal pulses bilaterally.  Peripheral Vascular Lower Extremity Palpation - Calf - Bilateral - soft/supple to palpation, appearance is not suggestive of DVT.  Neurologic Motor Strength - 5/5 normal muscle strength - Left Upper Extremity, Right Upper Extremity and Right Lower Extremity. 4/5 reduced muscle strength - Left Lower Extremity. Sensation Lower Extremity - Left - sensation is diminished in the lower extremity and sensation is diminished to light touch in the lower extremity. Right - sensation is intact in the lower extremity. Reflexes Babinski - Bilateral - Babinski not present. Ankle Clonus - Bilateral - Ankle clonus not present. Clonus - Bilateral - clonus not present.  Musculoskeletal Spine/Ribs/Pelvis Lumbosacral Spine - Evaluation of related systems reveals - well developed, well nourished and in no acute distress and alert and oriented X3. Assessment of pain reveals the following findings - The pain is characterized as - severe, burning and constant ache. Location - pain refers to hip on affected side, pain refers laterally to left lower back and pain refers to posterior leg on affected side.   RADIOGRAPHS: X-rays and MRI are reviewed with right lateral bending there is some correction and improvement in her overall curve. The left lateral bending only intensifies  her degenerative scoliotic curve. She has a significant left sided foraminal stenosis and facet arthrosis with degenerative disk disease at L2-3.  Assessment & Plan  Degenerative scoliosis in adult patient (M41.50) Spinal stenosis, lumbar (M48.06) Note:Because of her scoliosis, I do not think an isolated decompression is advisable as the scoliosis would only intensify.  I am recommending an XLIFT procedure to stabilize the joint and a posterior facet resection with instrumented posterior fusion.  Maybe able to use a lateral plate instead of screws - will determine intra-operatively.  I did review the risk with her to include infection, bleeding, nerve damage, death , stroke, paralysis, , failure to heal, ongoing or worse pain, injury to the lumbar plexus, difficulty walking due to hip flexor weakness and need for further surgery.  MRI does show transitional anamtomy - last disc space is S1/S2

## 2014-04-21 NOTE — Progress Notes (Signed)
Sacral foam prophylaxis not applied due to area of surgery/incision.

## 2014-04-22 ENCOUNTER — Inpatient Hospital Stay (HOSPITAL_COMMUNITY): Payer: PPO

## 2014-04-22 LAB — GLUCOSE, CAPILLARY
GLUCOSE-CAPILLARY: 192 mg/dL — AB (ref 70–99)
GLUCOSE-CAPILLARY: 211 mg/dL — AB (ref 70–99)
GLUCOSE-CAPILLARY: 237 mg/dL — AB (ref 70–99)
Glucose-Capillary: 208 mg/dL — ABNORMAL HIGH (ref 70–99)
Glucose-Capillary: 196 mg/dL — ABNORMAL HIGH (ref 70–99)
Glucose-Capillary: 187 mg/dL — ABNORMAL HIGH (ref 70–99)

## 2014-04-22 MED ORDER — MAGNESIUM CITRATE PO SOLN
1.0000 | Freq: Once | ORAL | Status: AC
  Administered 2014-04-22: 1 via ORAL
  Filled 2014-04-22: qty 296

## 2014-04-22 MED FILL — Thrombin For Soln 20000 Unit: CUTANEOUS | Qty: 1 | Status: AC

## 2014-04-22 NOTE — Progress Notes (Signed)
Inpatient Diabetes Program Recommendations  AACE/ADA: New Consensus Statement on Inpatient Glycemic Control (2013)  Target Ranges:  Prepandial:   less than 140 mg/dL      Peak postprandial:   less than 180 mg/dL (1-2 hours)      Critically ill patients:  140 - 180 mg/dL   Results for ANNALINA, NEEDLES (MRN [id number]) as of 04/22/2014 12:06  Ref. Range 04/21/2014 20:32 04/21/2014 23:15 04/22/2014 04:45 04/22/2014 08:07 04/22/2014 11:32  Glucose-Capillary Latest Range: 70-99 mg/dL 220 (H) 230 (H) 208 (H) 196 (H) 187 (H)     Reason for assessment: elevated CBG  Diabetes history: Type 2 Outpatient Diabetes medications: Glipizide 5mg /day Current orders for Inpatient glycemic control: Novolog correction 0-15 units tid, Glipizide 5mg /day  Please considering discontinuing the Glipizide while the patient is in the hospital and starting Lantus 10 units q day(0.11u/kg) .  When the patient begins to eat, consider switching her Novolog moderate correction to tid and hs.   Gentry Fitz, RN, BA, MHA, CDE Diabetes Coordinator Inpatient Diabetes Program  [phone] (Team Pager) [phone] [facility]) 04/22/2014 12:11 PM

## 2014-04-22 NOTE — Progress Notes (Signed)
Utilization review completed. Gurnoor Sloop, RN, BSN. 

## 2014-04-22 NOTE — Evaluation (Signed)
Occupational Therapy Evaluation Patient Details Name: Vanessa Atkins MRN: [id number] DOB: 1934/10/10 Today's Date: 04/22/2014    History of Present Illness 80 yo female s/p XLIF L2-3 with lateral plates / posterior decompression L2-3 fusion. PMH: R RTR, Cataract surg bil, R arthroscopy to knee, Breast CA   Clinical Impression   Patient is s/p XLIF L2-3 surgery resulting in functional limitations due to the deficits listed below (see OT problem list). Pt reports 8 out 10 pain supine and decreased to 6 out 10 at EOB dangling sitting. Pt awaiting brace at this time. OT to further assess d/c plan next session. Patient will benefit from skilled OT acutely to increase independence and safety with ADLS to allow discharge SNF ( pending progress with brace). Pt was unable to complete bed mobility without HOB raised and (A).     Follow Up Recommendations  SNF (to be further assessed)    Equipment Recommendations  Other (comment) (to be further assessed)    Recommendations for Other Services       Precautions / Restrictions Precautions Precautions: Back;Fall Precaution Comments: back handout provided Required Braces or Orthoses: Spinal Brace Spinal Brace: Lumbar corset;Applied in sitting position      Mobility Bed Mobility Overal bed mobility: Needs Assistance Bed Mobility: Supine to Sit;Sit to Supine     Supine to sit: Mod assist;HOB elevated Sit to supine: Min assist   General bed mobility comments: pt requires mOD v/c for sequence and following commands.pt needed total +2 for repositioning toward [facility]  Transfers                 General transfer comment: not assessed due to no brace    Balance Overall balance assessment: Needs assistance Sitting-balance support: Bilateral upper extremity supported;Feet supported Sitting balance-Leahy Scale: Fair                                      ADL Overall ADL's : Needs assistance/impaired Eating/Feeding:  Independent   Grooming: Wash/dry hands;Wash/dry face;Oral care;Modified independent   Upper Body Bathing: Minimal assitance   Lower Body Bathing: Total assistance                         General ADL Comments: Pt educated on back precautions , provided handout and able to complete teach back. Pt completed bed mobility and return to supine this session. Pt reports decr pain after mobility. Pt awaiting back brace at this time. OT to address LB dressing/ bathing next session and basic transfer. pt wants to d/c home but could require SNF depending on progression.      Vision     Perception     Praxis      Pertinent Vitals/Pain Pain Assessment: 0-10 Pain Score: 8  Pain Location: back Pain Descriptors / Indicators: Constant Pain Intervention(s): Monitored during session     Hand Dominance Right   Extremity/Trunk Assessment Upper Extremity Assessment Upper Extremity Assessment: RUE deficits/detail;LUE deficits/detail RUE Deficits / Details: able to perform 90 degree flexion not assessed past due to back precautions. Pt reports deficits LUE Deficits / Details: able to perform 90 degree flexion not assessed past due to back precautions. Pt reports deficits   Lower Extremity Assessment Lower Extremity Assessment: Defer to PT evaluation   Cervical / Trunk Assessment Cervical / Trunk Assessment: Other exceptions (surg )   Communication Communication Communication: No difficulties  Cognition Arousal/Alertness: Awake/alert Behavior During Therapy: WFL for tasks assessed/performed Overall Cognitive Status: Within Functional Limits for tasks assessed                     General Comments       Exercises       Shoulder Instructions      Home Living Family/patient expects to be discharged to:: Private residence Living Arrangements: Spouse/significant other Available Help at Discharge: Family;Available 24 hours/day Type of Home: House Home Access: Stairs to  enter [employer] of Steps: 2   Home Layout: One level;Other (Comment) (sunken den area)     Bathroom Shower/Tub: Tub/shower unit;Curtain   Bathroom Toilet: Handicapped height     Home Equipment: Environmental consultant - 2 wheels;Grab bars - tub/shower;Adaptive equipment Adaptive Equipment: Reacher Additional Comments: has two small dogs at home      Prior Functioning/Environment Level of Independence: Independent with assistive device(s)             OT Diagnosis: Generalized weakness;Acute pain   OT Problem List: Decreased strength;Decreased activity tolerance;Impaired balance (sitting and/or standing);Decreased safety awareness;Decreased knowledge of use of DME or AE;Decreased knowledge of precautions;Pain   OT Treatment/Interventions: Self-care/ADL training;Therapeutic exercise;Therapeutic activities;Patient/family education;Balance training;DME and/or AE instruction    OT Goals(Current goals can be found in the care plan section) Acute Rehab OT Goals Patient Stated Goal: to go home to her dogs OT Goal Formulation: With patient Time For Goal Achievement: 05/06/14 Potential to Achieve Goals: Good  OT Frequency: Min 2X/week   Barriers to D/C:            Co-evaluation              End of Session Nurse Communication: Mobility status;Precautions  Activity Tolerance: Patient limited by pain Patient left: in bed;with call bell/phone within reach;Other (comment) (remained in bed due to no back brace)   Time: 0277-4128 OT Time Calculation (min): 36 min Charges:  OT General Charges $OT Visit: 1 Procedure OT Evaluation $Initial OT Evaluation Tier I: 1 Procedure OT Treatments $Self Care/Home Management : 8-22 mins G-Codes:    Peri Maris 2014-05-19, 8:44 AM Pager: [phone]

## 2014-04-22 NOTE — Evaluation (Signed)
Physical Therapy Evaluation Patient Details Name: Vanessa Atkins MRN: [id number] DOB: 1934-04-02 Today's Date: 04/22/2014   History of Present Illness  80 yo female s/p XLIF L2-3 with lateral plates / posterior decompression L2-3 fusion. PMH: R RTR, Cataract surg bil, R arthroscopy to knee, Breast CA  Clinical Impression  Pt is able to get up OOB to chair with min mod assist and RW.  She will need to be able to progress gait into the hallway and ascend/descend 2 steps for home entry.  She has 24/7 assist from her husband, but he has his own health issues and cannot likely provide more than min guard assist at discharge.   PT to follow acutely for deficits listed below.       Follow Up Recommendations Home health PT;Supervision/Assistance - 24 hour    Equipment Recommendations  None recommended by PT    Recommendations for Other Services   NA    Precautions / Restrictions Precautions Precautions: Back;Fall Precaution Booklet Issued: Yes (comment) (by OT) Precaution Comments: PT verbally reviewed back precautions brace application and use.  Required Braces or Orthoses: Spinal Brace Spinal Brace: Lumbar corset;Applied in sitting position      Mobility  Bed Mobility Overal bed mobility: Needs Assistance Bed Mobility: Rolling;Sidelying to Sit Rolling: Mod assist Sidelying to sit: Min assist       General bed mobility comments: Mod assist to support trunk and hips to get all the way to side lying.  Pt able to help and pull with bil upper extremities on railing, but needed help controling her hips. Side to sit went better, but still heavy reliance on bed railing for leverag and therapist helping to progress legs over EOB.   Transfers Overall transfer level: Needs assistance   Transfers: Sit to/from Stand;Stand Pivot Transfers Sit to Stand: Min assist;From elevated surface Stand pivot transfers: Min assist;From elevated surface       General transfer comment: Min assist to  support trunk while turning and stepping with RW over weak legs.   Ambulation/Gait Ambulation/Gait assistance: Min assist Ambulation Distance (Feet): 5 Feet Assistive device: Rolling walker (2 wheeled) Gait Pattern/deviations: Step-through pattern;Shuffle     General Gait Details: Pt needs verbal cues for upright posture, assist to support trunk over weak legs during short distance gait.        Balance Overall balance assessment: Needs assistance Sitting-balance support: Feet supported;Bilateral upper extremity supported Sitting balance-Leahy Scale: Fair     Standing balance support: Bilateral upper extremity supported Standing balance-Leahy Scale: Fair Standing balance comment: able to statically stand and remove hands to assist in peri care and pulling up of adult undergarment.                              Pertinent Vitals/Pain Pain Assessment: 0-10 Pain Score: 7  Pain Location: low back Pain Descriptors / Indicators: Aching;Burning Pain Intervention(s): Limited activity within patient's tolerance;Monitored during session;Repositioned    Home Living Family/patient expects to be discharged to:: Private residence Living Arrangements: Spouse/significant other Available Help at Discharge: Family;Available 24 hours/day Type of Home: House Home Access: Stairs to enter Entrance Stairs-Rails: None (post and a recycle container that she pushes up on normally) Entrance Stairs-Number of Steps: 2 Home Layout: One level;Other (Comment) (sunken den area) Home Equipment: Gilford Rile - 2 wheels;Grab bars - tub/shower;Adaptive equipment;Walker - 4 wheels Additional Comments: has two small dogs at home    Prior Function Level of Independence: Independent  with assistive device(s)         Comments: used RWs all the time.      Hand Dominance   Dominant Hand: Right    Extremity/Trunk Assessment   Upper Extremity Assessment: Defer to OT evaluation           Lower  Extremity Assessment: Generalized weakness (per functional assessment, no obvious asymmetries/drag/buckl)         Communication   Communication: No difficulties  Cognition Arousal/Alertness: Awake/alert Behavior During Therapy: WFL for tasks assessed/performed Overall Cognitive Status: Within Functional Limits for tasks assessed                               Assessment/Plan    PT Assessment Patient needs continued PT services  PT Diagnosis Difficulty walking;Abnormality of gait;Acute pain;Generalized weakness   PT Problem List Decreased strength;Decreased activity tolerance;Decreased balance;Decreased mobility;Decreased knowledge of use of DME;Decreased knowledge of precautions;Pain  PT Treatment Interventions DME instruction;Gait training;Stair training;Functional mobility training;Therapeutic activities;Therapeutic exercise;Balance training;Neuromuscular re-education;Patient/family education;Modalities   PT Goals (Current goals can be found in the Care Plan section) Acute Rehab PT Goals Patient Stated Goal: to go home PT Goal Formulation: With patient Time For Goal Achievement: 04/29/14 Potential to Achieve Goals: Good    Frequency Min 5X/week   Barriers to discharge Decreased caregiver support Pt reports husband has his own health issues, but "he is still strong"       End of Session Equipment Utilized During Treatment: Back brace Activity Tolerance: Patient limited by pain Patient left: in chair;with call bell/phone within reach Nurse Communication: Mobility status         Time: 2641-5830 PT Time Calculation (min) (ACUTE ONLY): 43 min   Charges:   PT Evaluation $Initial PT Evaluation Tier I: 1 Procedure PT Treatments $Therapeutic Activity: 8-22 mins $Self Care/Home Management: 8-22        Shya Kovatch B. [place], [place], DPT [phone]   04/22/2014, 4:01 PM

## 2014-04-22 NOTE — Op Note (Signed)
NAMEELENY, CORTEZ NO.:  [id number]  MEDICAL RECORD NO.:  [id number]  LOCATION:  [id number]                        FACILITY:  [facility]  PHYSICIAN:  Dahlia Bailiff, MD    DATE OF BIRTH:  [birth year]  DATE OF PROCEDURE:  04/21/2014 DATE OF DISCHARGE:                              OPERATIVE REPORT   PREOPERATIVE DIAGNOSIS:  Degenerative spinal stenosis with underlying degenerative scoliosis.  POSTOPERATIVE DIAGNOSIS:  Degenerative spinal stenosis with underlying degenerative scoliosis.  OPERATIVE PROCEDURES: 1. Lateral interbody diskectomy with insertion of intervertebral     biomechanical device. 2. Lateral instrumented fusion segmental, L2-3 with lateral plate. 3. Posterior decompression, L2-3 with Gill decompression L2-3 with in     situ fusion, posterolateral arthrodesis.  COMPLICATIONS:  None.  CONDITION:  Stable.  HISTORY:  This is a very pleasant 80 year old woman who has been having severe debilitating back, buttock, and neuropathic left leg pain. Despite exhaustive conservative care, she continued to have pain and loss of quality of life.  As a result, we elected to proceed with surgery.  We discussed all appropriate risks, benefits, and alternatives to surgery.  INSTRUMENTATION SYSTEM USED:  NuVasive PEEK lateral interbody cage, size 12 x 22 parallel packed with Osteocel with a 14 mm 4-hole lateral plate with 50 mm screws in L3 and 45 mm screws in L2.  OPERATIVE NOTE:  The patient was brought to the operating room, placed supine on the operating table.  After successful induction of general anesthesia and endotracheal intubation, TEDs, SCDs, and Foley were inserted.  Needles for intraoperative neuro monitoring were placed.  The patient was then turned into the lateral decubitus position, right side down.  Axillary roll was placed.  All bony prominences were padded.  The arms were placed in individual arm holders.  At this point, x-ray was brought  into the OR, and the patient was properly positioned so that I could visualize the L2-3 disk space.  The patient does have transitional anatomy and so the last complete disk space was read as L5-S1, and then there was a rudimentary disk, which was considered S1-S2.  I confirmed comparing the x-rays with MRI that I was at the appropriate level.  Once I realized I could visualize the appropriate level and she was well- positioned, she was taped down to the bed.  Once she was secured to the bed, the bed was broken and she was tilted down.  This provided better correction of her scoliosis in the right lateral bend position.  Once this was completed, I prepped and draped the left flank.  Time-out was then taken to confirm the patient, procedure, and all other pertinent important data.  Once this was done, I identified the posterior and anterior margins of the L2-3 disk space and infiltrated this proposed incision site with 0.25% Marcaine with epi.  I then made an incision and dissected down to the muscle fibers of the external oblique.  I then made a second incision about the size of my finger in the posterolateral aspect and bluntly dissected down to the posterior aspect of the retroperitoneum.  I then used a Claiborne Billings to enter into the retroperitoneum and then placed my finger into the  retroperitoneum.  This allowed me to mobilize the adipose tissue and bluntly dissect.  I then brought my finger to the undersurface of the oblique and then dissected through the oblique until I could see my finger.  I then placed the first trocar along my finger and brought it down to the L2-3 disk space.  Once I was properly positioned and I confirmed this with the AP and lateral x-rays, I then placed the guide pin through this trocar to keep it in place.  I then stimulated the trocar and moved in a circumferential fashion confirming I was not irritating the plexus.  I then sequentially dilated to the final  dilator again with each dilator stimulating to ensure there was no abnormal neural conduction.  Once the final dilator was in place, I then placed the retracting blades into the wound.  The retractor was secured to the bed.  Once I confirmed a satisfactory position of the retractor in both the AP and lateral planes, I stimulated along the undersurface of each of the blades to confirm that they were not compressing the plexus.  I then placed the Shim blade down the posterior blade so it would secure into the disk space.  Once this was done, I then pushed the blades forward until I was at the level of the anterior longitudinal ligament.  At this point, I had an excellent view of the lateral aspect of the L2-3 disk.  Before incising, I did check one last time counting up from the L5 level in the lateral plane to confirm that I was at the L2-3 disk space.  Once this was confirmed, I incised the disk with a #10 blade scalpel, then using a combination of pituitary rongeurs, curettes, and Kerrison rongeurs, I removed all of the disk material.  I then used a Cobb curette to scrape along the endplates of L2 and L3 and released the contralateral annulus.  Once that was done, I then trialed sequentially and elected to use the size 12 parallel spacer as it provided the best fit and provided excellent restoration of disk space height as well as foraminal volume.  I then at this point used the rasp to ensure I had bleeding subchondral bone.  I then obtained a 12 x 22 parallel cage packed with DBX mix.  It was then [facility] to the appropriate position.  Once it was secured into place, I then obtained the size 14 lateral plate.  This was a 4-hole plate.  I removed the overhanging osteophytes that were remaining at the L3 level, which allowed the plate to fit down.  Once the plate was properly situated, I then secured it with temporary pins into the body of L2 and L3.  I then used the awl to create the  screw hole and then placed the appropriate size screws.  All 4 screws had excellent purchase.  Once they were secured down, I then used a torquing device to lock them in place.  I then torqued the plate as well.  I then irrigated the wound copiously with normal saline, obtained hemostasis using bipolar electrocautery and FloSeal.  I then removed the retracting device and irrigated the wound again copiously with normal saline.  I closed the muscle defect in the oblique with interrupted #1 Vicryl sutures, superficial with 2-0 Vicryl sutures, and 3-0 Monocryl for the skin.  I washed out the second incision with irrigation and closed the deep layer with 0-Vicryl suture and then superficial with 2-0 Vicryl sutures,  and 3-0 Monocryl for the skin.  X-rays at this time were satisfactory.  There was actual improvement in her scoliotic curve and excellent restoration of disk space height at the level of L2-3.  At this point, the second table was brought in, which was the [place] table with a Wilson frame.  The patient was then turned prone onto the Wilson frame.  All bony prominences again were well padded and the back was prepped and draped in a standard fashion.  A second time-out was taken again confirming the posterior portion of the procedure, which would be an L2-3 Gill decompression with in situ fusion.  Once this was done, I then made an incision centered over the L2-3 disk space.  Sharp dissection was carried out down to the deep fascia.  I exposed the L1-2 and L2-3 facet complexes as well as the L2 and L3 transverse processes.  This was accomplished using Bovie and Cobb elevator.  Once the posterior approach was completed and I had the posterolateral gutter exposed, I then continued with my decompression. I confirmed once again with AP and lateral fluoro that I was at the appropriate level and then using a double-action Leksell rongeur, I removed the bulk of the L2 spinous process.  I  then developed a plane underneath the lamina of L2 and performed a generous laminotomy of L2. This allowed me to dissect in the central raphe between the ligamentum flavum and the thecal sac.  I removed the majority of the ligamentum flavum in the central region.  I then undercut the superior leading edge of the L3 lamina.  With the central decompression complete, I then continued out into the lateral gutter.  Since the left side was the worst where the neuropathic pain was, I removed the entire L2 inferior facet.  Once this was done, I was able to decompress into the lateral recess.  I also performed a foraminotomy.  At this point, I knew that there was excellent room for the L2 nerve root not only from the foraminotomy from the XLIF procedure that was done induced with indirect decompression.  I then went down on the right side, decompressing the lateral recess and again checking the foramen and the lateral recess to ensure there was adequate decompression.  Once this was done, I used a high-speed bur and decorticated the transverse processes of L2 and L3 and removed the entire remaining facet capsule on the right side at L2- 3.  I then packed the posterolateral gutter with bone graft that I had harvested from the decompression along with DBX mix and the remaining portion of Osteocel.  The wound was irrigated copiously with normal saline.  I made sure hemostasis using bipolar electrocautery and FloSeal.  A thrombin-soaked Gelfoam patty was placed over the thecal sac, and then I closed this with interrupted #1 Vicryl sutures for the deep fascia, 2-0 Vicryl sutures for the superficial, and 3-0 Monocryl for the skin.  Steri-Strips and dry dressings were applied.  The patient was ultimately extubated and transferred to the PACU without incident. At the end of the case, all needle and sponge counts were correct. There were no adverse intraoperative events.     Dahlia Bailiff,  MD     DDB/MEDQ  D:  04/21/2014  T:  04/22/2014  Job:  [id number]

## 2014-04-22 NOTE — Progress Notes (Signed)
PT Cancellation Note  Patient Details Name: Vanessa Atkins MRN: [id number] DOB: 05/30/1934   Cancelled Treatment:    Reason Eval/Treat Not Completed: Other (comment) (no brace yet).  PT will check back this PM for brace.  See OT note for EOB details.  Thanks,    Barbarann Ehlers. [place], [place], DPT [phone]   04/22/2014, 10:51 AM

## 2014-04-22 NOTE — Progress Notes (Signed)
    Subjective: Procedure(s) (LRB): XLIF L2 - L3 WITH LATERAL PLATES/POSTERIOR DECOMPRESSION L2 - L3 FUSION  (N/A) 1 Day Post-Op  Patient reports pain as 5 on 0-10 scale.  Reports left groin / leg pain.   reports incisional back pain   Foley in place Negative bowel movement Negative flatus Negative chest pain or shortness of breath  Objective: Vital signs in last 24 hours: Temp:  [96.5 F (35.8 C)-98.8 F (37.1 C)] 98.8 F (37.1 C) (04/01 0515) Pulse Rate:  [80-105] 105 (04/01 0515) Resp:  [16-34] 17 (04/01 0515) BP: (102-146)/(48-64) 105/58 mmHg (04/01 0515) SpO2:  [93 %-100 %] 98 % (04/01 0515)  Intake/Output from previous day: 03/31 0701 - 04/01 0700 In: 2470 [P.O.:120; I.V.:2350] Out: 1900 [Urine:1800; Blood:100]  Labs:  Recent Labs  04/20/14 1423  WBC 5.8  RBC 4.08  HCT 37.2  PLT 156    Recent Labs  04/20/14 1423  NA 141  K 4.0  CL 106  CO2 27  BUN 15  CREATININE 0.93  GLUCOSE 136*  CALCIUM 9.7   No results for input(s): LABPT, INR in the last 72 hours.  Physical Exam: Neurologically intact ABD soft Intact pulses distally Dorsiflexion/Plantar flexion intact Incision: dressing C/D/I Compartment soft  Assessment/Plan: Patient stable  xrays satisfactory.  Improved alignment in both planes.  Hardware satisfactory position Continue mobilization with physical therapy Continue care  Advance diet Up with therapy D/C IV fluids  Plan on d/c Sunday or Monday depending upon progress with therapy May require SNF or CIR   Melina Schools, MD [place] [phone]

## 2014-04-23 LAB — GLUCOSE, CAPILLARY
GLUCOSE-CAPILLARY: 189 mg/dL — AB (ref 70–99)
GLUCOSE-CAPILLARY: 168 mg/dL — AB (ref 70–99)
Glucose-Capillary: 248 mg/dL — ABNORMAL HIGH (ref 70–99)
Glucose-Capillary: 205 mg/dL — ABNORMAL HIGH (ref 70–99)
Glucose-Capillary: 185 mg/dL — ABNORMAL HIGH (ref 70–99)

## 2014-04-23 LAB — HEMOGLOBIN A1C
HEMOGLOBIN A1C: 6.9 % — AB (ref 4.8–5.6)
MEAN PLASMA GLUCOSE: 151 mg/dL

## 2014-04-23 MED ORDER — DEXTROSE 5 % IV SOLN
500.0000 mg | Freq: Four times a day (QID) | INTRAVENOUS | Status: DC | PRN
Start: 2014-04-23 — End: 2014-04-25

## 2014-04-23 MED ORDER — ALPRAZOLAM 0.5 MG PO TABS
2.0000 mg | ORAL_TABLET | Freq: Every evening | ORAL | Status: DC | PRN
  Administered 2014-04-24 (×2): 2 mg via ORAL
  Filled 2014-04-23 (×2): qty 4

## 2014-04-23 MED ORDER — METHOCARBAMOL 500 MG PO TABS
500.0000 mg | ORAL_TABLET | Freq: Four times a day (QID) | ORAL | Status: DC | PRN
Start: 2014-04-23 — End: 2014-04-25
  Administered 2014-04-25: 500 mg via ORAL
  Filled 2014-04-23: qty 1

## 2014-04-23 NOTE — Progress Notes (Signed)
Physical Therapy Treatment Patient Details Name: Vanessa Atkins MRN: [id number] DOB: January 14, 1935 Today's Date: 04/23/2014    History of Present Illness 80 yo female s/p XLIF L2-3 with lateral plates / posterior decompression L2-3 fusion. PMH: R RTR, Cataract surg bil, R arthroscopy to knee, Breast CA    PT Comments    Pt is making progress, albeit slowly to ambulate into the hallway today.  She requires min assist overall, but has limited endurance and relies heavily on outside supports (bed rails, RW) for stability during mobility.  She has limited activity tolerance and limited sitting tolerance due to pain right now.  She lives with an elderly husband who will likely only be able to provide supervision at discharge.  She would benefit from a CIR consult/stay prior to d/c to help increase her independence and safety for return home.  RN made aware of recommendation.    Follow Up Recommendations  CIR     Equipment Recommendations  None recommended by PT    Recommendations for Other Services Rehab consult     Precautions / Restrictions Precautions Precautions: Back;Fall Precaution Booklet Issued: Yes (comment) Precaution Comments: Pt able to report 1/3 back precautions unassisted.   Required Braces or Orthoses: Spinal Brace;Other Brace/Splint Spinal Brace: Lumbar corset;Applied in sitting position Other Brace/Splint: per pt MD said it was ok to take brace off once seated and for trips to the bathroom.     Mobility  Bed Mobility Overal bed mobility: Needs Assistance Bed Mobility: Rolling;Sidelying to Sit Rolling: Min assist Sidelying to sit: Min assist       General bed mobility comments: Min assist to support trunk and hips to get to side lying.  Verbal cues for log roll technique.  Pt relying heavily on hands to push up from bed rail for leverage to get to sitting.   Transfers Overall transfer level: Needs assistance Equipment used: Rolling walker (2 wheeled) Transfers:  Sit to/from Stand Sit to Stand: Min assist         General transfer comment: Min assist to support trunk during transitions.  Verbal cues for safe hand placement and to stabilize RW and pt during transition of hands.  Pt stood over flexed knees and very slowly.   Ambulation/Gait Ambulation/Gait assistance: Min assist Ambulation Distance (Feet): 40 Feet Assistive device: Rolling walker (2 wheeled) Gait Pattern/deviations: Step-through pattern;Shuffle Gait velocity: decreased Gait velocity interpretation: Below normal speed for age/gender General Gait Details: Verbal cues for upright posture, pt using safe RW technique. Assist needed at trunk to help support pt over weak legs and painful left leg during gait.           Balance Overall balance assessment: Needs assistance Sitting-balance support: Feet supported;No upper extremity supported Sitting balance-Leahy Scale: Fair     Standing balance support: No upper extremity supported;Bilateral upper extremity supported;Single extremity supported Standing balance-Leahy Scale: Fair                      Cognition Arousal/Alertness: Awake/alert Behavior During Therapy: WFL for tasks assessed/performed Overall Cognitive Status: Within Functional Limits for tasks assessed                             Pertinent Vitals/Pain Pain Assessment: 0-10 Pain Score: 7  Pain Location: low back and left thigh Pain Descriptors / Indicators: Aching;Burning Pain Intervention(s): Monitored during session;Limited activity within patient's tolerance;Repositioned     PT Goals (current goals can  now be found in the care plan section) Acute Rehab PT Goals Patient Stated Goal: to go home Progress towards PT goals: Progressing toward goals    Frequency  Min 5X/week    PT Plan Current plan remains appropriate       End of Session Equipment Utilized During Treatment: Back brace Activity Tolerance: Patient limited by  pain;Patient limited by fatigue Patient left: in chair;with call bell/phone within reach     Time: 1016-1040 PT Time Calculation (min) (ACUTE ONLY): 24 min  Charges:  $Gait Training: 8-22 mins $Therapeutic Activity: 8-22 mins                      Skylene Deremer B. [place], [place], DPT [phone]   04/23/2014, 10:49 AM

## 2014-04-23 NOTE — Progress Notes (Signed)
Occupational Therapy Treatment Patient Details Name: Vanessa Atkins MRN: [id number] DOB: 05-Apr-1934 Today's Date: 04/23/2014    History of present illness 80 yo female s/p XLIF L2-3 with lateral plates / posterior decompression L2-3 fusion. PMH: R RTR, Cataract surg bil, R arthroscopy to knee, Breast CA   OT comments  Pt with anxiety about moving, but requiring minimum assistance.  She moves slowly and is heavily reliant on UEs for bed and OOB mobility with pain being a limiting factor. Began education in use of adaptive equipment, will need further education/practice. Pt with difficulty recalling and generalizing back precautions. Recommending inpatient rehab consult.    Follow Up Recommendations  CIR    Equipment Recommendations       Recommendations for Other Services      Precautions / Restrictions Precautions Precautions: Back;Fall Precaution Booklet Issued: Yes (comment) Precaution Comments: pt able to report 2/3 back precautions Required Braces or Orthoses: Spinal Brace Spinal Brace: Lumbar corset;Applied in sitting position Other Brace/Splint: per pt MD said it was ok to take brace off once seated and for trips to the bathroom.        Mobility Bed Mobility Overal bed mobility: Needs Assistance Bed Mobility: Rolling;Sidelying to Sit;Sit to Sidelying Rolling: Min assist Sidelying to sit: Min assist     Sit to sidelying: Mod assist General bed mobility comments: assist for LEs, heavy reliance on rail, verbal and tactile cues for technique  Transfers Overall transfer level: Needs assistance Equipment used: Rolling walker (2 wheeled) Transfers: Sit to/from Stand Sit to Stand: Min assist Stand pivot transfers: Min assist;From elevated surface       General transfer comment: Min assist to support trunk during transitions.  Verbal cues for safe hand placement and to stabilize RW and pt during transition of hands.  Pt stood over flexed knees and very slowly.      Balance Overall balance assessment: Needs assistance Sitting-balance support: Feet supported;No upper extremity supported Sitting balance-Leahy Scale: Fair     Standing balance support: No upper extremity supported;Bilateral upper extremity supported;Single extremity supported Standing balance-Leahy Scale: Fair                     ADL Overall ADL's : Needs assistance/impaired               Lower Body Bathing Details (indicate cue type and reason): instructed pt in use of long handled bath sponge     Lower Body Dressing: Moderate assistance;Sit to/from stand Lower Body Dressing Details (indicate cue type and reason): instructed pt in use of reacher, sock aid and long shoe horn Toilet Transfer: Minimal assistance;Ambulation;BSC;RW   Toileting- Water quality scientist and Hygiene: Min guard;Sit to/from stand Toileting - Clothing Manipulation Details (indicate cue type and reason): instructed pt in use of toilet tongs and wet wipes     Functional mobility during ADLs: Minimal assistance;Rolling walker General ADL Comments: Pt reporting increase pain from prolonged sitting in chair this morning. Agreeable to sitting EOB for instruction and requesting return to supine.      Vision                     Perception     Praxis      Cognition   Behavior During Therapy: [facility] for tasks assessed/performed Overall Cognitive Status: Within Functional Limits for tasks assessed                       Extremity/Trunk Assessment  Exercises     Shoulder Instructions       General Comments      Pertinent Vitals/ Pain       Pain Assessment: Faces Pain Score: 7  Faces Pain Scale: Hurts even more Pain Location: back Pain Descriptors / Indicators: Aching Pain Intervention(s): Limited activity within patient's tolerance;Monitored during session;Premedicated before session;Repositioned  Home Living                                           Prior Functioning/Environment              Frequency Min 2X/week     Progress Toward Goals  OT Goals(current goals can now be found in the care plan section)  Progress towards OT goals: Progressing toward goals  Acute Rehab OT Goals Patient Stated Goal: to go home  Plan Discharge plan needs to be updated    Co-evaluation                 End of Session Equipment Utilized During Treatment: Gait belt;Rolling walker   Activity Tolerance Patient limited by pain   Patient Left in bed;with call bell/phone within reach;with family/visitor present   Nurse Communication          Time: 1497-0263 OT Time Calculation (min): 27 min  Charges: OT General Charges $OT Visit: 1 Procedure OT Treatments $Self Care/Home Management : 23-37 mins  Malka So 04/23/2014, 11:52 AM

## 2014-04-23 NOTE — Progress Notes (Addendum)
Subjective: 2 Days Post-Op Procedure(s) (LRB): XLIF L2 - L3 WITH LATERAL PLATES/POSTERIOR DECOMPRESSION L2 - L3 FUSION  (N/A) Patient reports pain as moderate.  States she had a bad nightmare last night. Has not yet had pain meds today. Voiding without difficulty. Positive flatus, no BM yet. Right leg feeling better, Left leg still with pain and weakness.  Objective: Vital signs in last 24 hours: Temp:  [98.4 F (36.9 C)-99 F (37.2 C)] 98.4 F (36.9 C) (04/02 0416) Pulse Rate:  [86-103] 86 (04/02 0416) Resp:  [16] 16 (04/02 0416) BP: (118-149)/(56-68) 149/66 mmHg (04/02 0416) SpO2:  [99 %-100 %] 100 % (04/02 0416)  Intake/Output from previous day: 04/01 0701 - 04/02 0700 In: 720 [P.O.:720] Out: -  Intake/Output this shift: Total I/O In: 240 [P.O.:240] Out: -    Recent Labs  04/20/14 1423  HGB 12.3    Recent Labs  04/20/14 1423  WBC 5.8  RBC 4.08  HCT 37.2  PLT 156    Recent Labs  04/20/14 1423  NA 141  K 4.0  CL 106  CO2 27  BUN 15  CREATININE 0.93  GLUCOSE 136*  CALCIUM 9.7   No results for input(s): LABPT, INR in the last 72 hours.  Neurologically intact ABD soft Neurovascular intact Sensation intact distally Intact pulses distally Dorsiflexion/Plantar flexion intact Incision: dressing C/D/I and no drainage No cellulitis present Compartment soft no sign of DVT  Assessment/Plan: 2 Days Post-Op Procedure(s) (LRB): XLIF L2 - L3 WITH LATERAL PLATES/POSTERIOR DECOMPRESSION L2 - L3 FUSION  (N/A) Advance diet Up with therapy D/C IV fluids  Up with PT Discussed pain control and medications Discussed with Dr. Rolena Infante who also saw pt this AM Will add Xanax prn at night prn for sleep Plan D/C tomorrow vs. Monday  BISSELL, JACLYN M. 04/23/2014, 9:39 AM

## 2014-04-23 NOTE — Care Management Note (Signed)
CARE MANAGEMENT NOTE 04/23/2014  Patient:  Vanessa Atkins, Vanessa Atkins   Account Number:  [id number]  Date Initiated:  04/23/2014  Documentation initiated by:  Oliveras-Aizpurua,Kaile Bixler  Subjective/Objective Assessment:   80 yo F, s/p XLIF L2-3 with lateral plates/posterior decompression L2-3 fusion     Action/Plan:   PT is recommending CIR   Anticipated DC Date:  04/25/2014   Anticipated DC Plan:  IP REHAB FACILITY      DC Planning Services  CM consult      Choice offered to / List presented to:             Status of service:  In process, will continue to follow Medicare Important Message given?   (If response is "NO", the following Medicare IM given date fields will be blank) Date Medicare IM given:   Medicare IM given by:   Date Additional Medicare IM given:  04/23/2014 Additional Medicare IM given by:  Norina Buzzard  Discharge Disposition:    Per UR Regulation:    If discussed at Long Length of Stay Meetings, dates discussed:    Comments:  04/23/14 1545 - Frann Rider, RN, BSN  Met with pt to discuss d/c plan. Explained CIR. She agrees with CIR or SNF if she is not able to go to CIR. She lives with her husband who has memory issues. CIR referral. Will continue to f/u.

## 2014-04-24 LAB — GLUCOSE, CAPILLARY
GLUCOSE-CAPILLARY: 200 mg/dL — AB (ref 70–99)
GLUCOSE-CAPILLARY: 255 mg/dL — AB (ref 70–99)
GLUCOSE-CAPILLARY: 225 mg/dL — AB (ref 70–99)
Glucose-Capillary: 184 mg/dL — ABNORMAL HIGH (ref 70–99)
Glucose-Capillary: 228 mg/dL — ABNORMAL HIGH (ref 70–99)

## 2014-04-24 NOTE — Progress Notes (Signed)
   Subjective: 3 Days Post-Op Procedure(s) (LRB): XLIF L2 - L3 WITH LATERAL PLATES/POSTERIOR DECOMPRESSION L2 - L3 FUSION  (N/A) Patient reports pain as mild.   Patient seen in rounds for Dr. Rolena Infante. Patient is well, and has had no acute complaints or problems. She reports that she slept very well last night. She says that she is really only having pain in the left leg and back with transitional movements, such as when she is getting out of bed. Other than that, she reports that she is feeling well. No issues overnight. No SOB or chest pain. Voiding well. No Bm but positive flatus. She reports desire to go home tomorrow.    Objective: Vital signs in last 24 hours: Temp:  [99 F (37.2 C)-99.3 F (37.4 C)] 99.1 F (37.3 C) (04/03 0446) Pulse Rate:  [84-94] 91 (04/03 0446) Resp:  [18] 18 (04/03 0446) BP: (101-177)/(46-75) 101/46 mmHg (04/03 0446) SpO2:  [97 %] 97 % (04/03 0446)  Intake/Output from previous day:  Intake/Output Summary (Last 24 hours) at 04/24/14 0651 Last data filed at 04/23/14 1300  Gross per 24 hour  Intake    480 ml  Output      0 ml  Net    480 ml     EXAM General - Patient is Alert and Oriented Extremity - Neurologically intact Neurovascular intact Sensation intact distally Intact pulses distally Dorsiflexion/Plantar flexion intact Dressing/Incision - clean, dry, no drainage   Past Medical History  Diagnosis Date  . PONV (postoperative nausea and vomiting)   . Family history of adverse reaction to anesthesia     sister PONV  . Insomnia   . Diabetes mellitus without complication   . Hypothyroidism   . Overactive bladder   . Cancer     breast and uterine  . Wears glasses   . Constipation due to opioid therapy   . Bronchial asthma     last bout around 2009  . Arthritis   . Scoliosis     degenerative scoliosis    Assessment/Plan: 3 Days Post-Op Procedure(s) (LRB): XLIF L2 - L3 WITH LATERAL PLATES/POSTERIOR DECOMPRESSION L2 - L3 FUSION   (N/A) Active Problems:   Back pain  Estimated body mass index is 32.63 kg/(m^2) as calculated from the following:   Height as of 04/20/14: 5\' 7"  (1.702 m).   Weight as of this encounter: 94.518 kg (208 lb 6 oz). Advance diet Up with therapy  She is doing much better. She seems to have responded positively to the change to Xanax. We will continue PT today. Possible DC home tomorrow depending on progress today.   Ardeen Jourdain, PA-C Orthopaedic Surgery 04/24/2014, 6:51 AM

## 2014-04-24 NOTE — Progress Notes (Signed)
Physical Therapy Treatment Patient Details Name: Vanessa Atkins MRN: [id number] DOB: 09-Jul-1934 Today's Date: 04/24/2014    History of Present Illness 80 yo female s/p XLIF L2-3 with lateral plates / posterior decompression L2-3 fusion. PMH: R RTR, Cataract surg bil, R arthroscopy to knee, Breast CA    PT Comments    Slow progress, but definite improvements with bed mobility, and pt assures me that her husband will be able to provide adequate assist; Session focused on bed mobility in particular, and making sure pt will be able to cue her husband appropriately to assist her;   Pt very much wants to dc home with husband assist; in this case, strongly recommend HHPT and [place]  Follow Up Recommendations  Home health PT;Supervision/Assistance - 24 hour     Equipment Recommendations  None recommended by PT    Recommendations for Other Services Rehab consult     Precautions / Restrictions Precautions Precautions: Back;Fall Precaution Booklet Issued: Yes (comment) Precaution Comments: Reviewed precautions Required Braces or Orthoses: Spinal Brace Spinal Brace: Lumbar corset;Applied in sitting position Other Brace/Splint: per pt MD said it was ok to take brace off once seated and for trips to the bathroom.     Mobility  Bed Mobility Overal bed mobility: Needs Assistance Bed Mobility: Rolling;Sidelying to Sit;Sit to Sidelying Rolling: Min assist Sidelying to sit: Min assist     Sit to sidelying: Mod assist General bed mobility comments: Worked on getting up without rail; Cues to roll fully into sidelying ("stack your shoulders and hips on top of each other"), and to keep hips flexed during transitions to and from sidelying; min assist and/or light mod assist to get feet onto bed  Transfers Overall transfer level: Needs assistance Equipment used: Rolling walker (2 wheeled) Transfers: Sit to/from Stand Sit to Stand: Min guard         General transfer comment:  Verbal cues  for safe hand placement and to stabilize RW and pt during transition of hands.  Smoother transition to stand today  Ambulation/Gait Ambulation/Gait assistance: Min guard Ambulation Distance (Feet): 50 Feet Assistive device: Rolling walker (2 wheeled) Gait Pattern/deviations: Step-through pattern Gait velocity: decreased   General Gait Details: Continued cues for upright posture, and to push down into RW for support   Stairs Stairs:  (We discussed technique, and pt declined stairs today)          Wheelchair Mobility    Modified Rankin (Stroke Patients Only)       Balance                                    Cognition Arousal/Alertness: Awake/alert Behavior During Therapy: WFL for tasks assessed/performed Overall Cognitive Status: Within Functional Limits for tasks assessed                      Exercises      General Comments        Pertinent Vitals/Pain Pain Assessment: 0-10 Pain Score: 8  Pain Location: back while walking Pain Descriptors / Indicators: Aching;Grimacing Pain Intervention(s): Limited activity within patient's tolerance;Monitored during session;Repositioned    Home Living                      Prior Function            PT Goals (current goals can now be found in the care plan section) Acute Rehab  PT Goals Patient Stated Goal: to go home PT Goal Formulation: With patient Time For Goal Achievement: 04/29/14 Potential to Achieve Goals: Good Progress towards PT goals: Progressing toward goals    Frequency  Min 5X/week    PT Plan Discharge plan needs to be updated    Co-evaluation             End of Session Equipment Utilized During Treatment: Back brace Activity Tolerance: Patient tolerated treatment well Patient left: in bed;with call bell/phone within reach     Time: 1446-1516 PT Time Calculation (min) (ACUTE ONLY): 30 min  Charges:  $Gait Training: 8-22 mins $Therapeutic Activity: 8-22  mins                    G Codes:      Quin Hoop 04/24/2014, 4:26 PM  Roney Marion, [place] Pager [phone] Office [phone]

## 2014-04-25 LAB — GLUCOSE, CAPILLARY
Glucose-Capillary: 202 mg/dL — ABNORMAL HIGH (ref 70–99)
Glucose-Capillary: 204 mg/dL — ABNORMAL HIGH (ref 70–99)
Glucose-Capillary: 220 mg/dL — ABNORMAL HIGH (ref 70–99)
Glucose-Capillary: 265 mg/dL — ABNORMAL HIGH (ref 70–99)

## 2014-04-25 MED ORDER — HYDROCODONE-ACETAMINOPHEN 10-325 MG PO TABS: 1.0000 | ORAL_TABLET | Freq: Four times a day (QID) | ORAL | Status: AC | PRN

## 2014-04-25 MED ORDER — METHOCARBAMOL 500 MG PO TABS: 500.0000 mg | ORAL_TABLET | Freq: Three times a day (TID) | ORAL | Status: AC | PRN

## 2014-04-25 MED ORDER — DOCUSATE SODIUM 100 MG PO CAPS: 100.0000 mg | ORAL_CAPSULE | Freq: Three times a day (TID) | ORAL | Status: AC | PRN

## 2014-04-25 MED ORDER — ONDANSETRON HCL 4 MG PO TABS: 4.0000 mg | ORAL_TABLET | Freq: Three times a day (TID) | ORAL | Status: AC | PRN

## 2014-04-25 NOTE — Progress Notes (Signed)
Physical medicine rehabilitation consult requested with chart reviewed. Patient status post lumbar posterior decompression 04/22/2014. As of late his physical therapy notes 04/24/2014 progressing nicely ambulating minimal guard and patient requests discharge to home with home health physical and occupational therapy. Hold on formal rehabilitation consult this time monitor progress and feel patient should be able to discharge to home with home therapies.

## 2014-04-25 NOTE — Progress Notes (Signed)
Physical Therapy Treatment Patient Details Name: Vanessa Atkins MRN: [id number] DOB: [birth year] Today's Date: 04/25/2014    History of Present Illness 80 yo female s/p XLIF L2-3 with lateral plates / posterior decompression L2-3 fusion. PMH: R RTR, Cataract surg bil, R arthroscopy to knee, Breast CA    PT Comments    Patient eager to DC home and progressing well with mobility. There are some safety concerns as the patient tends to appear very independent and initially not wanting HHPT but after some education she agreed. Able to complete stair training this morning and she does have assistance from her husband once home. Patient safe to D/C from a mobility standpoint based on progression towards goals set on PT eval.    Follow Up Recommendations  Home health PT;Supervision/Assistance - 24 hour     Equipment Recommendations  None recommended by PT    Recommendations for Other Services       Precautions / Restrictions Precautions Precautions: Back;Fall Precaution Comments: Reviewed precautions Required Braces or Orthoses: Spinal Brace Other Brace/Splint: per pt MD said it was ok to take brace off once seated and for trips to the bathroom.     Mobility  Bed Mobility Overal bed mobility: Needs Assistance   Rolling: Supervision Sidelying to sit: Min guard       General bed mobility comments: Cues for positioning and log roll technique. Patient stated that she does have rail on side of bed (?). Able to position LEs correctly and push up with UEs strength well.  Transfers Overall transfer level: Needs assistance Equipment used: Rolling walker (2 wheeled)   Sit to Stand: Supervision         General transfer comment: Patient able to "teach back" technique for sit to stand  Ambulation/Gait Ambulation/Gait assistance: Supervision Ambulation Distance (Feet): 150 Feet Assistive device: Rolling walker (2 wheeled) Gait Pattern/deviations: Step-through pattern;Decreased  stride length Gait velocity: decreased   General Gait Details: Cues for posture. Patient with one posterior LOB when raising hands off RW.    Stairs Stairs: Yes Stairs assistance: Min assist Stair Management: Step to pattern;Forwards;One rail Left Number of Stairs: 2 General stair comments: HHA on Left. Cues for technique.   Wheelchair Mobility    Modified Rankin (Stroke Patients Only)       Balance                                    Cognition Arousal/Alertness: Awake/alert Behavior During Therapy: WFL for tasks assessed/performed Overall Cognitive Status: Within Functional Limits for tasks assessed                      Exercises      General Comments        Pertinent Vitals/Pain Pain Score: 4  Pain Location: back while walking Pain Descriptors / Indicators: Aching;Sore Pain Intervention(s): Monitored during session;Repositioned    Home Living                      Prior Function            PT Goals (current goals can now be found in the care plan section) Progress towards PT goals: Progressing toward goals    Frequency  Min 5X/week    PT Plan Current plan remains appropriate    Co-evaluation             End of Session  Equipment Utilized During Treatment: Back brace Activity Tolerance: Patient tolerated treatment well Patient left: in chair;with call bell/phone within reach     Time: 0956-1021 PT Time Calculation (min) (ACUTE ONLY): 25 min  Charges:  $Gait Training: 8-22 mins $Therapeutic Activity: 8-22 mins                    G Codes:      Jacqualyn Posey 04/25/2014, 10:29 AM 04/25/2014 Jacqualyn Posey PTA [phone] pager [phone] office

## 2014-04-25 NOTE — Progress Notes (Signed)
    Subjective: Procedure(s) (LRB): XLIF L2 - L3 WITH LATERAL PLATES/POSTERIOR DECOMPRESSION L2 - L3 FUSION  (N/A) 4 Days Post-Op  Patient reports pain as 2 on 0-10 scale.  Reports decreased leg pain reports incisional back pain   Positive void Positive bowel movement Positive flatus Negative chest pain or shortness of breath  Objective: Vital signs in last 24 hours: Temp:  [98.7 F (37.1 C)-98.8 F (37.1 C)] 98.7 F (37.1 C) (04/04 0733) Pulse Rate:  [71-92] 92 (04/04 0733) Resp:  [16] 16 (04/04 0733) BP: (113-145)/(53-62) 113/53 mmHg (04/04 0733) SpO2:  [96 %-97 %] 97 % (04/04 0733)  Intake/Output from previous day: 04/03 0701 - 04/04 0700 In: 483 [P.O.:480; I.V.:3] Out: -   Labs: No results for input(s): WBC, RBC, HCT, PLT in the last 72 hours. No results for input(s): NA, K, CL, CO2, BUN, CREATININE, GLUCOSE, CALCIUM in the last 72 hours. No results for input(s): LABPT, INR in the last 72 hours.  Physical Exam: Neurologically intact ABD soft Intact pulses distally Dorsiflexion/Plantar flexion intact Incision: dressing C/D/I Compartment soft  Assessment/Plan: Patient stable  xrays n/a Continue mobilization with physical therapy Continue care  Plan on d/c to home with [place], MD [place] [phone]

## 2014-04-25 NOTE — Progress Notes (Signed)
Occupational Therapy Treatment Patient Details Name: Vanessa Atkins MRN: [id number] DOB: 06-02-1934 Today's Date: 04/25/2014    History of present illness 80 yo female s/p XLIF L2-3 with lateral plates / posterior decompression L2-3 fusion. PMH: R RTR, Cataract surg bil, R arthroscopy to knee, Breast CA   OT comments  Pt demonstrates ability to return home with spouse (A). Pt biggest limitation is bed mobility sit<>supine. Ot will continue to practice this transfers. Pt able to don doff brace without (A) at this time.    Follow Up Recommendations  Home health OT    Equipment Recommendations  None recommended by OT    Recommendations for Other Services      Precautions / Restrictions Precautions Precautions: Back;Fall Precaution Comments: Reviewed precautions Required Braces or Orthoses: Spinal Brace Other Brace/Splint: per pt MD said it was ok to take brace off once seated and for trips to the bathroom.        Mobility Bed Mobility Overal bed mobility: Needs Assistance Bed Mobility: Sit to Supine Rolling: Supervision Sidelying to sit: Min guard   Sit to supine: Mod assist   General bed mobility comments: Cues for positioning and log roll technique. Patient stated that she does have rail on side of bed (?). Able to position LEs correctly and push up with UEs strength well.  Transfers Overall transfer level: Needs assistance Equipment used: Rolling walker (2 wheeled) Transfers: Sit to/from Stand Sit to Stand: Supervision         General transfer comment: Patient able to "teach back" technique for sit to stand    Balance                                   ADL Overall ADL's : Needs assistance/impaired Eating/Feeding: Independent   Grooming: Wash/dry hands;Oral care;Wash/dry face;Modified independent;Standing           Upper Body Dressing : Modified independent;Sitting (don doff brace)       Toilet Transfer:  Supervision/safety;Ambulation;RW;BSC Toilet Transfer Details (indicate cue type and reason): completed without (A)         Functional mobility during ADLs: Supervision/safety;Rolling walker General ADL Comments: pt ambulated below normal gait appropriate gait speed the distance from bedroom to kitchen. pt required more than 4 steps for turning. both factors demonstrate incr fall risk for patient. Pt completed don doff brace and bathroom level task. Pt is unable to complete sit<>Supine without (A) with BIL LE. pt reports spouse can (A).      Vision                     Perception     Praxis      Cognition   Behavior During Therapy: WFL for tasks assessed/performed Overall Cognitive Status: Within Functional Limits for tasks assessed                       Extremity/Trunk Assessment               Exercises     Shoulder Instructions       General Comments      Pertinent Vitals/ Pain       Pain Assessment: No/denies pain Pain Score: 4  Pain Location: back while walking Pain Descriptors / Indicators: Aching;Sore Pain Intervention(s): Monitored during session;Repositioned  Home Living  Prior Functioning/Environment              Frequency Min 2X/week     Progress Toward Goals  OT Goals(current goals can now be found in the care plan section)  Progress towards OT goals: Progressing toward goals  Acute Rehab OT Goals Patient Stated Goal: to go home OT Goal Formulation: With patient Time For Goal Achievement: 05/06/14 Potential to Achieve Goals: Good ADL Goals Pt Will Perform Grooming: with supervision;standing (met) Pt Will Perform Upper Body Bathing: with supervision;sitting (met) Pt Will Perform Lower Body Bathing: with supervision;sit to/from stand;with adaptive equipment Pt Will Perform Upper Body Dressing: with supervision;sitting (MET) Pt Will Perform Lower Body Dressing:  with supervision;sit to/from stand;with adaptive equipment Pt Will Transfer to Toilet: with supervision;bedside commode (MET) Pt Will Perform Tub/Shower Transfer: with min assist;Tub transfer;rolling walker  Plan Discharge plan needs to be updated    Co-evaluation                 End of Session Equipment Utilized During Treatment: Gait belt;Rolling walker;Back brace   Activity Tolerance Patient tolerated treatment well   Patient Left in bed;with call bell/phone within reach   Nurse Communication Mobility status;Precautions        Time: 0301-3143 OT Time Calculation (min): 33 min  Charges: OT General Charges $OT Visit: 1 Procedure OT Treatments $Self Care/Home Management : 23-37 mins  Parke Poisson B 04/25/2014, 10:50 AM  Pager: [phone]

## 2014-04-25 NOTE — Care Management Note (Signed)
CARE MANAGEMENT NOTE 04/25/2014  Patient:  BLAKELEE, ALLINGTON   Account Number:  [id number]  Date Initiated:  04/23/2014  Documentation initiated by:  Oliveras-Aizpurua,Jeannette  Subjective/Objective Assessment:   80 yo F, s/p XLIF L2-3 with lateral plates/posterior decompression L2-3 fusion     Action/Plan:   PT is recommending CIR   Anticipated DC Date:  04/25/2014   Anticipated DC Plan:  [place]  CM consult      [facility] Choice  HOME HEALTH   Choice offered to / List presented to:  C-1 Patient   DME arranged  NA        [place] arranged  [place].   Status of service:  Completed, signed off Medicare Important Message given?   (If response is "NO", the following Medicare IM given date fields will be blank) Date Medicare IM given:   Medicare IM given by:   Date Additional Medicare IM given:  04/23/2014 Additional Medicare IM given by:  Norina Buzzard  Discharge Disposition:  [place]  Per UR Regulation:    If discussed at Long Length of Stay Meetings, dates discussed:    Comments:  04/25/14 10:15am Ricki Miller, RN BSN Case Manager Patient does not want to go to CIR.CM spoke with patient concerning home health and DME needs. Choice offered. Referral called to [place], [facility] Liaison. patient states she has several types of walkers at home, hase elevated toilets. patient's husband will assist her at discharge.

## 2014-04-25 NOTE — Discharge Summary (Signed)
Patient ID: Vanessa Atkins MRN: [id number] DOB/AGE: 1934/04/17 80 y.o.  Admit date: 04/21/2014 Discharge date: 04/25/2014  Admission Diagnoses:  Active Problems:   Back pain   Discharge Diagnoses:  Active Problems:   Back pain  status post Procedure(s): XLIF L2 - L3 WITH LATERAL PLATES/POSTERIOR DECOMPRESSION L2 - L3 FUSION   Past Medical History  Diagnosis Date  . PONV (postoperative nausea and vomiting)   . Family history of adverse reaction to anesthesia     sister PONV  . Insomnia   . Diabetes mellitus without complication   . Hypothyroidism   . Overactive bladder   . Cancer     breast and uterine  . Wears glasses   . Constipation due to opioid therapy   . Bronchial asthma     last bout around 2009  . Arthritis   . Scoliosis     degenerative scoliosis    Surgeries: Procedure(s): XLIF L2 - L3 WITH LATERAL PLATES/POSTERIOR DECOMPRESSION L2 - L3 FUSION  on 04/21/2014   Consultants:    Discharged Condition: Improved  Hospital Course: Vanessa Atkins is an 80 y.o. female who was admitted 04/21/2014 for operative treatment of degenerative scoliosis with stenosis. Patient failed conservative treatments (please see the history and physical for the specifics) and had severe unremitting pain that affects sleep, daily activities and work/hobbies. After pre-op clearance, the patient was taken to the operating room on 04/21/2014 and underwent  Procedure(s): XLIF L2 - L3 WITH LATERAL PLATES/POSTERIOR DECOMPRESSION L2 - L3 FUSION .    Patient was given perioperative antibiotics: Anti-infectives    Start     Dose/Rate Route Frequency Ordered Stop   04/21/14 1930  vancomycin (VANCOCIN) IVPB 1000 mg/200 mL premix     1,000 mg 200 mL/hr over 60 Minutes Intravenous  Once 04/21/14 1619 04/21/14 2143   04/20/14 1348  vancomycin (VANCOCIN) IVPB 1000 mg/200 mL premix     1,000 mg 200 mL/hr over 60 Minutes Intravenous 60 min pre-op 04/20/14 1348 04/21/14 0731       Patient  was given sequential compression devices and early ambulation to prevent DVT.   Patient benefited maximally from hospital stay and there were no complications. At the time of discharge, the patient was urinating/moving their bowels without difficulty, tolerating a regular diet, pain is controlled with oral pain medications and they have been cleared by PT/OT.   Recent vital signs: Patient Vitals for the past 24 hrs:  BP Temp Temp src Pulse Resp SpO2  04/25/14 0733 (!) 113/53 mmHg 98.7 F (37.1 C) Oral 92 16 97 %  04/24/14 2008 (!) 145/62 mmHg 98.8 F (37.1 C) Oral 71 16 96 %     Recent laboratory studies: No results for input(s): WBC, HGB, HCT, PLT, NA, K, CL, CO2, BUN, CREATININE, GLUCOSE, INR, CALCIUM in the last 72 hours.  Invalid input(s): PT, 2   Discharge Medications:     Medication List    STOP taking these medications        HYDROcodone-acetaminophen 5-325 MG per tablet  Commonly known as:  NORCO/VICODIN  Replaced by:  HYDROcodone-acetaminophen 10-325 MG per tablet      TAKE these medications        alprazolam 2 MG tablet  Commonly known as:  XANAX  Take 2 mg by mouth at bedtime as needed for sleep.     docusate sodium 100 MG capsule  Commonly known as:  COLACE  Take 1 capsule (100 mg total) by mouth 3 (  three) times daily as needed for mild constipation.     gabapentin 300 MG capsule  Commonly known as:  NEURONTIN  Take 300-600 mg by mouth 3 (three) times daily.     glipiZIDE 5 MG tablet  Commonly known as:  GLUCOTROL  Take 5 mg by mouth daily before breakfast.     HYDROcodone-acetaminophen 10-325 MG per tablet  Commonly known as:  NORCO  Take 1 tablet by mouth every 6 (six) hours as needed.     hydrocortisone valerate ointment 0.2 %  Commonly known as:  WEST-CORT  Apply 1 application topically 2 (two) times daily as needed (skin rash).     levothyroxine 50 MCG tablet  Commonly known as:  SYNTHROID, LEVOTHROID  Take 50 mcg by mouth daily before  breakfast.     lisinopril 10 MG tablet  Commonly known as:  PRINIVIL,ZESTRIL  Take 10 mg by mouth daily.     methocarbamol 500 MG tablet  Commonly known as:  ROBAXIN  Take 1 tablet (500 mg total) by mouth 3 (three) times daily as needed for muscle spasms.     multivitamin with minerals tablet  Take 1 tablet by mouth daily.     ondansetron 4 MG tablet  Commonly known as:  ZOFRAN  Take 1 tablet (4 mg total) by mouth every 8 (eight) hours as needed for nausea or vomiting.     oxybutynin 5 MG tablet  Commonly known as:  DITROPAN  Take 5 mg by mouth daily.     tamoxifen 20 MG tablet  Commonly known as:  NOLVADEX  Take 20 mg by mouth daily.        Diagnostic Studies: Dg Lumbar Spine 2-3 Views  04/22/2014   CLINICAL DATA:  Postoperative lumbar fusion  EXAM: LUMBAR SPINE - 2-3 VIEW  COMPARISON:  April 21, 2014  FINDINGS: Frontal and lateral views were obtained. There is screw and plate fixation extending through the fell to at L3 vertebral bodies from a leftward approach. There is a disc spacer at L2-3. Screw tips are slightly beyond the vertebral bodies on the right. There is no fracture or spondylolisthesis. There is dextroscoliosis. There is disc space narrowing to varying degrees at all levels.  IMPRESSION: Postoperative change at L2 and L3 with the screw and plate fixation device and disc spacer appearing intact. No fracture or spondylolisthesis. Scoliosis and multilevel osteoarthritic change.   Electronically Signed   By: Lowella Grip III M.D.   On: 04/22/2014 07:48   Dg Lumbar Spine 2-3 Views  04/21/2014   CLINICAL DATA:  L2-3 surgery  EXAM: DG C-ARM GT 120 MIN; LUMBAR SPINE - 2-3 VIEW  COMPARISON:  None.  FLUOROSCOPY TIME:  Radiation Exposure Index (as provided by the fluoroscopic device): Not available  If the device does not provide the exposure index:  Fluoroscopy Time:  2 minutes 52 seconds  Number of Acquired Images:  3  FINDINGS: There are changes consistent with interbody  fusion at L2-3 with lateral fixation on the left.  IMPRESSION: Lumbar fusion at L2-3   Electronically Signed   By: Inez Catalina M.D.   On: 04/21/2014 14:30   Dg C-arm Gt 120 Min  04/21/2014   CLINICAL DATA:  L2-3 surgery  EXAM: DG C-ARM GT 120 MIN; LUMBAR SPINE - 2-3 VIEW  COMPARISON:  None.  FLUOROSCOPY TIME:  Radiation Exposure Index (as provided by the fluoroscopic device): Not available  If the device does not provide the exposure index:  Fluoroscopy Time:  2  minutes 52 seconds  Number of Acquired Images:  3  FINDINGS: There are changes consistent with interbody fusion at L2-3 with lateral fixation on the left.  IMPRESSION: Lumbar fusion at L2-3   Electronically Signed   By: Inez Catalina M.D.   On: 04/21/2014 14:30          Follow-up Information    Follow up with [facility].   Why:  Someone from [facility] will contact you concerning start date and time for therapy.    Contact information:   [address] [place] [address] [phone]       Follow up with Dahlia Bailiff, MD In 10 days.   Specialty:  Orthopedic Surgery   Why:  For suture removal, For wound re-check   Contact information:   [address] [place] [address] [phone]       Discharge Plan:  discharge to home  Disposition: patient doing well.  Hospital course unremarkable.  Pain improved, ambulating well with PT.  Plan on d/c to home with HHS.  F/u 10 days for wound check      Signed: Melina Schools D for Dr. Melina Schools [facility] [phone] 04/25/2014, 12:08 PM

## 2014-04-25 NOTE — Progress Notes (Signed)
Inpatient Diabetes Program Recommendations  AACE/ADA: New Consensus Statement on Inpatient Glycemic Control (2013)  Target Ranges:  Prepandial:   less than 140 mg/dL      Peak postprandial:   less than 180 mg/dL (1-2 hours)      Critically ill patients:  140 - 180 mg/dL   Results for Vanessa Atkins, Vanessa Atkins (MRN [id number]) as of 04/25/2014 10:59  Ref. Range 04/24/2014 04:50 04/24/2014 11:55 04/24/2014 17:16 04/24/2014 20:10 04/25/2014 00:16 04/25/2014 04:28 04/25/2014 08:35  Glucose-Capillary Latest Range: 70-99 mg/dL 200 (H) 228 (H) 255 (H) 225 (H) 202 (H) 220 (H) 265 (H)    Diabetes history: DM2 Outpatient Diabetes medications: Glipizide 5 mg QAM Current orders for Inpatient glycemic control: Glipizide 5 mg QAM, Novolog 0-15 units Q4H  Inpatient Diabetes Program Recommendations Insulin - Basal: If Decadron continued, please consider ordering low dose basal insulin while inpatient. Recommend ordering Levemir 10 units Q24H (starting now). Correction (SSI): If patient is eating at least 50% of meals, please consider changing frequency of CBGs and Novolog correction to ACHS. Insulin - Meal Coverage: If Decadron continued, please consider ordering Novolog 5 units TID with meals if patient eats at least 50% of meals.  Thanks, Barnie Alderman, RN, MSN, CCRN, CDE Diabetes Coordinator Inpatient Diabetes Program [phone] (Team Pager from [place] to [place]) [phone] ([facility] office) [phone] [facility] office)

## 2014-04-26 ENCOUNTER — Encounter (HOSPITAL_COMMUNITY): Payer: Self-pay | Admitting: Orthopedic Surgery

## 2014-05-04 NOTE — Addendum Note (Signed)
Addendum  created 05/04/14 1604 by Roberts Gaudy, MD   Modules edited: Anesthesia Attestations

## 2014-05-10 ENCOUNTER — Encounter (HOSPITAL_COMMUNITY): Payer: Self-pay | Admitting: Orthopedic Surgery

## 2014-06-06 NOTE — Patient Outreach (Signed)
[facility]) Care Management  06/06/2014  Vanessa Atkins 1934/03/12 [id number]   Received referral from [place], assigned to [name], RN.  [name]. [place], [place] Management [place] Assistant Phone: [phone] Fax: [phone]

## 2014-06-27 ENCOUNTER — Other Ambulatory Visit: Payer: Self-pay

## 2014-06-27 DIAGNOSIS — H269 Unspecified cataract: Secondary | ICD-10-CM

## 2014-06-27 DIAGNOSIS — N189 Chronic kidney disease, unspecified: Secondary | ICD-10-CM

## 2014-06-27 HISTORY — DX: Unspecified cataract: H26.9

## 2014-06-27 HISTORY — DX: Chronic kidney disease, unspecified: N18.9

## 2014-06-27 NOTE — Patient Outreach (Signed)
[facility]) Care Management  06/27/2014  Vanessa Atkins 03-Nov-1934 [id number]   Successful Outreach letter sent to patient 06/27/2014.  [name], RN, BSN, [facility], CCM  [facility] [employer] Management Coordinator [phone] Office [phone] Direct [phone] Cell

## 2014-06-27 NOTE — Patient Outreach (Signed)
[facility]) Care Management  06/27/2014  COURTNIE BRENES 1934/03/15 [id number]    Telephonic Care Coordination Note:   Shingles vaccines: no. H/o of shingles (2009) left side back to front.   Patient reported Gabapentin has helped with residual pain.   RN CM sent in-basket update to primary MD, Dr. Nevada Crane to request MD discuss option of Shingles Vaccine with patient on next office visit.   [name], RN, BSN, [facility], CCM  [facility] [employer] Management Coordinator [phone] Office [phone] Direct [phone] Cell

## 2014-06-27 NOTE — Patient Outreach (Signed)
[facility]) Care Management  06/27/2014  Vanessa Atkins 1934/08/31 [id number]  Telephonic Care Coordination Note:  Referral Date:  06/06/2014 Referral Source:  [place] Referral Issue:  Inpatient 04/21/2014 at [facility] and Diabetes  Insurance:  healthteam advantage [id number] Triage Screening and Initial Assessment 06/27/2014 Case Management Start Date 06/27/2014 Admissions:  1 (04/21/2014) Back surgery  ED:  0  PCP:  Dr. Delphina Atkins Spine Surgery Dr. Melina Atkins, [place] [phone] Oncologist: Dr. Myrtis Atkins, Jr., [place];  out of network; has list of MD's to pick from. Vanessa Atkins and  out of network; in process of getting new MD; patient has list of MD's to pick from.   HHS:  H/o past services with [place]:   Married, lives in her home with husband, Vanessa Atkins (who has Alzheimer's Level 1) in [place].  Recently able to return back to church following 03/2014 back surgery which is pleasing to patient.   Falls:  None over the past year.  Ambulates with walker due to back surgery 03/2014, safety and fall prevention.  Depression:  None Caregiver:  Husband  With Alzheimer's but still able to help patient with ADLs as needed and still driving.   Other Support System:  Son is in [place], other in [place], [place] but busy with sons; can call.  Neighbors.  Transportation:  Husband.  Owns a Print[employer].  DME:  Cane, walker, elevated toilets, glucometer (One-touch about 3 years), diabetic testing supplies, reading glasses.   Advance Directives  Yes but completed in [place].  Patient states she was informed that her document is not effective in [place].  Patient states she has copies of Vanessa Atkins forms to complete and plans to go to the bank where she can have her form witnessed and notarized.  Plans to complete for herself and her husband.    RN CM  advised of option of [facility] LCSW resource but patient denies need for this service at this time.   RN CM will continue to follow-up with patient on progress of completing advance directives.   Back Pain; Degenerative Scoliosis with Stenosis Admit date: 04/21/2014 Discharge date: 04/25/2014 operative treatment of degenerative scoliosis with stenosis:  XLIF L2 - L3 WITH LATERAL PLATES/POSTERIOR DECOMPRESSION L2 - L3 FUSION .  Still wearing a back brace and walking with a walker.   Patient states she is still having a hard time and C/O pain in the back.  States she was initially prescribed a stronger pain medication but caused too much constipation and MD changed to prescription.  Patient reports mobility has improved since surgery and she is happy she recently was able to return back to church.  Patient was driving prior to surgery but has not started driving yet. Keeping regular follow-up appt's with Dr. Rolena Atkins.   RN CM reviewed safety and fall prevention.  RN CM encouraged to continue to use walker and ambulate in the house to maintain mobility and self rehab.  RN CM encouraged to take pain medication as MD has prescribed until pain improves.  RN CM encouraged increased fruits and vegetables and continue with Colace as prescribed to avoid constipation.   DM stage 2  A1c:  6.9 on 04/21/14.   New labs this morning at  Dr. Shepard [facility] office.   BS:  115 this morning.  Patient states she takes extended release pill for management.  States she checks every morning but unable to state she checks twice a day as MD has ordered.    Eyes:  Last check over 1 year ago but due again; missed yearly appt around 04/2014 due back surgery.  Plans to reschedule her appt.  Wears reading glasses only.   H/o Cataract surgery 2015.  Feet:  Patient checks her feet but not everyday.   Confirms Dr. Nevada Crane completes regular feet checks. Height:  5 feet - 7 inches Weight: 190 BMI:  29.8 Glucometer: One touch since about 2013.  RN  CM encouraged patient to work toward goal of lowering A1c RN CM discussed compliance and need to check BS twice a day as MD has ordered as this will help MD better assess when true changes in medication modifications may be needed. RN CM encouraged weight management to also improve BMI,  diabetes management and lower health risk.  RN CM will review twice a day BS checks on next contact call.  RN CM mailed educational materials to provide update on diet and DM updates Ha[facility] Folder) on 06/27/2014 RN CM mailed copy of BS log sheet tracker and encouraged to take document to next primary MD appt.   Chronic Kidney Disease Stage 1 Patient denies hypertension and states her BP medication is used for management of Chronic Kidney Disease Stage 1 and not HTN.   R Breast Ca with mastectomy (2015) Next mammogram due about September, 2016.   Currently seeking new Oncologist due to previous MD in [place] no longer in net-work; has list of MD's to pick from.  Remains on Tamoxifen and current plan is to take for 5 years.   RN CM encouraged establishing MD soon to avoid lapse in Tamoxifen refill orders.    Medications:   Medication reconciliation completed this call.  Patient takes medications compliantly.  Patient able to state reason why she takes all her medications.    Preventives Flu vaccine:  yes 10/21/2013, 10/21/2012 Pneumonia vaccine:  yes 07/30/11 Shingles vaccines: no.  H/o of shingles (2009)  left side back to front.  States Gabapentin has helped with residual pain.  08/2013 mammogram - yearly due 08/2014 Pap smear - Total hysterectomy due to uterine cancer (2010).  No further pap's recommended Colonoscopy:  Age 86 and no more recommended.  RN CM will sent request to MD to discuss option of Shingles Vaccine with patient on next office visit.   Consent Patient gives verbal consent for [facility] services and next monthly contact call and care coordination calls as needed.  RN CM advised of written  consent form to be mailed to patient for review and signature; please return to [facility] RN CM mailed consent on 06/27/2014  Patient identifies self goals: "To be able to walk without a walker and get rid of my brace" "Be able to drive again" "Lower Q0G" RN CM goals:  Disease Management education:  DM Compliance with BS readings two times a day Initiate Logging of  BS readings two times a day.   Plan / Care Coordination:   RN CM sent to administrative assistant to update case status:  Active, Acuity level 3, telephonic care coordination services effective 06/27/2014.  RN CM sent request to administrative assistant to mail introductory package, consent form, orange diabetes education folder, blood sugar logging sheet,  on 06/27/2014 RN CM sent initial assessment and barriers letter to primary MD on 06/27/2014 RN CM sent successful outreach letter to patient on 06/27/2014.  [name], RN, BSN, MSHL, CCM  [facility] Management Coordinator [phone] Office [phone] Direct [phone] Cell

## 2014-08-05 ENCOUNTER — Other Ambulatory Visit: Payer: Self-pay

## 2014-08-05 DIAGNOSIS — E119 Type 2 diabetes mellitus without complications: Secondary | ICD-10-CM | POA: Insufficient documentation

## 2014-08-05 DIAGNOSIS — N181 Chronic kidney disease, stage 1: Secondary | ICD-10-CM

## 2014-08-05 DIAGNOSIS — C50911 Malignant neoplasm of unspecified site of right female breast: Secondary | ICD-10-CM

## 2014-08-05 DIAGNOSIS — M48061 Spinal stenosis, lumbar region without neurogenic claudication: Secondary | ICD-10-CM | POA: Insufficient documentation

## 2014-08-05 DIAGNOSIS — M419 Scoliosis, unspecified: Secondary | ICD-10-CM

## 2014-08-05 DIAGNOSIS — Z9011 Acquired absence of right breast and nipple: Secondary | ICD-10-CM | POA: Insufficient documentation

## 2014-08-05 DIAGNOSIS — Z8619 Personal history of other infectious and parasitic diseases: Secondary | ICD-10-CM

## 2014-08-05 NOTE — Patient Outreach (Signed)
[facility]) Care Management  08/05/2014  Vanessa Atkins 1934-10-30 [id number]   Telephonic Care Coordination Note:  Referral Date: 06/06/2014 Referral Source: [place] Referral Issue: Inpatient 04/21/2014 at [facility] and Diabetes  Insurance: healthteam advantage [id number] Triage Screening and Initial Assessment 06/27/2014 Case Management Start Date 06/27/2014 Admissions: 1 (04/21/2014) Back surgery  ED: 0  Interdisciplinary Team PCP: Dr. Delphina Cahill  - last appt 06/2014 - next appt 12/2014.  Spine Surgery Dr. Melina Schools, [place] [phone] Oncologist: Dr. Myrtis Ser, [place] Bonito., [place]; out of network; has list of MD's to pick from. [place] and out of network; in process of getting new MD; patient has list of MD's to pick from.  HHS: H/O past services with [place]:  Married, lives in her home with husband, Vanessa Atkins (who has Alzheimer's Level 1) in [place]. Ambulating with no assistance in the home and using a walker outside the home.  Falls: None over the past year. Depression: None Caregiver: Husband With Alzheimer's but still able to help patient with ADLs as needed and still driving.  Other Support System: Son is in [place], other in [place], [place] but busy with sons; can call. Neighbors.  Transportation: Husband. Owns a [employer].  DME: Cane, walker, elevated toilets, glucometer (One-touch about 3 years), diabetic testing supplies, reading glasses.  RN CM encouraged patient to use walker to avoid falls and provide support and stability during back surgery recovery efforts.   Advance Directives  H/O patient reported on 06/27/2014 RN CM contact call:  Yes but completed in [place]. Patient states she was informed that her document is not effective in [place]. Patient states she has copies of   forms to complete and plans to go to the bank where she can have her form witnessed and notarized. Plans to complete for herself and her husband. Patient refused option of [facility] LCSW assistance.  08/05/14 Outcome:  Patient has completed document.  Patient has not provided copy to MD.  RN CM encouraged patient to make a copy and provide to Primary MD:  Dr. Nevada Crane.   RN CM encouraged to mail a copy to [facility] to place on file with [facility] system.  RN CM advised will mail patient a self addressed / stamped envelope to return copy back to [facility].  RN CM will continue to follow-up with patient on progress of completing advance directives.   Back Pain; Degenerative Scoliosis with Stenosis  operative treatment of degenerative scoliosis with stenosis: XLIF L2 - L3 WITH LATERAL PLATES/POSTERIOR DECOMPRESSION L2 - L3 FUSION . Admit date: 04/21/2014 - Discharge date: 04/25/2014 Patient has progressed and no longer having to wear a back brace.  Using walker outside the home but able to ambulate inside the home with no AMD's.   Keeping regular follow-up appt's with Dr. Rolena Infante.  RN CM reviewed safety and fall prevention.  RN CM encouraged to continue to use walker and ambulate to maintain mobility and safe rehab.  RN CM encouraged to take pain medication as MD has prescribed until pain improves.  RN CM encouraged increased fruits and vegetables and continue with Colace as prescribed to avoid constipation.   DM stage 2  A1C: 6.2 on 06/28/2014.   Patient states she takes extended release pill for management. States MD advised her she does not need to self check BS readings due to low A1C.  Eyes: Last check  over 1 year ago but due again; missed yearly appt around 04/2014 due back surgery. Plans to reschedule her appt. Wears reading glasses only. H/O Cataract surgery 2015.  Feet: Patient checks her feet but not everyday. Confirms Dr. Nevada Crane completes regular feet checks. Height: 5 feet - 7 inches Weight:  190 BMI: 29.8 Glucometer: One touch since about 2013.  RN CM encouraged weight management to also improve BMI, diabetes management and lower health risk.  RN CM mailed educational materials to provide update on diet and DM updates (Orange Folder) on 06/27/2014 - (Reviewed on 08/05/2014)  Patient states she feels good about her Diabetes knowledge and denies any other knowledge deficits.   RN CM mailed copy of BS log sheet tracker and encouraged to take document to next primary MD appt. (Reviewed on 08/04/14 - patient states MD advised she des not need to self check her BS readings at home).  Goals met.  Chronic Kidney Disease Stage 1 Patient denies hypertension and states her BP medication is used for management of Chronic Kidney Disease Stage 1 and not HTN.   R Breast Ca with mastectomy (2015) Next mammogram due about September, 2016.  Currently seeking new Oncologist due to previous MD in [place] no longer in net-work; has list of MD's to pick from. Remains on Tamoxifen and current plan is to take for 5 years. Tamoxifen currently prescribed by Primary MD.   Patient states her Primary MD is following her during this time until New Oncologist established.  Patient states she is able to arrange appt and denies any care coordination needs.  Goals Met.   Medications:  Medication reconciliation completed this call. Patient takes medications compliantly. Patient able to state reason why she takes all her medications.   Preventives Flu vaccine: yes 10/21/2013, 10/21/2012 Pneumonia vaccine: yes 07/30/11 Shingles vaccines: no. H/O of shingles (2009) left side back to front. States Gabapentin has helped with residual pain.  08/2013 mammogram - yearly due 08/2014 Pap smear - Total hysterectomy due to uterine cancer (2010). No further pap's recommended Colonoscopy: Age 32 and no more recommended.  RN CM will sent request to MD to discuss option of Shingles Vaccine with patient on  next office visit.  Patient states she will discuss with her MD.  Goals met.   Consent Patient gives verbal consent for [facility] services and next monthly contact call and care coordination calls as needed.  RN CM advised of written consent form mailed to patient 06/27/14 for review and signature; please return to [facility] Patient states she received but was confused about how to complete.  RN CM reviewed instructions for completion and requested to return to [facility].  RN CM encouraged patient to contact RN CM for any assistance needed with care coordination needs.   RN CM advised to please notify MD of any changes in her condition prior to scheduled appt's.   RN CM provided contact name and #, 24-hour nurse line # [phone].   RN CM confirmed patient is aware of 911 services for urgent emergency needs.  Plan  RN CM will follow-up with patient within 30 days for Telephonic Assessment.  RN CM will plan to close case once Copy of Advanced Directive received as all goals of care have been met at this time with this exception.   [name], RN, BSN, [facility], CCM  [facility] [employer] Management Coordinator [phone] Office [phone] Direct [phone] Cell

## 2014-08-25 NOTE — Patient Outreach (Signed)
[place] [facility]) Care Management  08/25/2014  Vanessa Atkins Sep 28, 1934 [id number]   Copy of Advance Directives received at [place] Management office, notification sent to [name], RN.  [name]. [place], [place] Management [name] Assistant Phone: [phone] Fax: [phone]

## 2014-09-02 ENCOUNTER — Other Ambulatory Visit: Payer: Self-pay

## 2014-09-05 ENCOUNTER — Ambulatory Visit: Payer: Self-pay

## 2014-09-06 ENCOUNTER — Other Ambulatory Visit: Payer: Self-pay

## 2014-09-06 NOTE — Patient Outreach (Signed)
[facility]) Care Management  09/06/2014  Vanessa Atkins Jan 18, 1935 [id number]   Telephonic Care Management Note  Monthly assessment outreach call to patient.  Patient states no changes in her conditions other than she continues to have back pain.  Confirms she did follow up with Dr. Rolena Infante office PA who ordered updated lab work, X-rays and ordered an MRI and referred to Pain Management Center.   States Dr. Rolena Infante office advised in no plans to prescribe any pain medication and prefers to refer to pain center or Primary MD.  Patient states she refused the MRI because she has elected not to have any more surgery.  States she refused the Pain management Center and elects to continue her pain management services through her PCP, Dr. Nevada Crane.  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   Patient denies any falls and is able to ambulate safely.   RN CM advised patient [facility] Goals of Care have been met; RN CM will close case.  RN CM advised to please notify MD of any changes in her condition prior to scheduled appt's.   RN CM confirmed patient is aware of 911 services for urgent emergency needs. RN CM will send Dr. Nevada Crane case closure notification letter.   Vanessa Laster, RN, BSN, [facility], CCM  [facility] [employer] Management Coordinator [phone] Direct [phone] Cell [phone] Office [phone] Fax

## 2015-02-03 DIAGNOSIS — F411 Generalized anxiety disorder: Secondary | ICD-10-CM | POA: Diagnosis not present

## 2015-02-03 DIAGNOSIS — I10 Essential (primary) hypertension: Secondary | ICD-10-CM | POA: Diagnosis not present

## 2015-02-03 DIAGNOSIS — M25512 Pain in left shoulder: Secondary | ICD-10-CM | POA: Diagnosis not present

## 2015-02-03 DIAGNOSIS — M545 Low back pain: Secondary | ICD-10-CM | POA: Diagnosis not present

## 2015-02-03 DIAGNOSIS — G589 Mononeuropathy, unspecified: Secondary | ICD-10-CM | POA: Diagnosis not present

## 2015-04-07 DIAGNOSIS — M419 Scoliosis, unspecified: Secondary | ICD-10-CM | POA: Diagnosis not present

## 2015-04-07 DIAGNOSIS — M47817 Spondylosis without myelopathy or radiculopathy, lumbosacral region: Secondary | ICD-10-CM | POA: Diagnosis not present

## 2015-04-07 DIAGNOSIS — M859 Disorder of bone density and structure, unspecified: Secondary | ICD-10-CM | POA: Diagnosis not present

## 2015-04-26 DIAGNOSIS — M9913 Subluxation complex (vertebral) of lumbar region: Secondary | ICD-10-CM | POA: Diagnosis not present

## 2015-04-26 DIAGNOSIS — Z981 Arthrodesis status: Secondary | ICD-10-CM | POA: Diagnosis not present

## 2015-04-26 DIAGNOSIS — M4184 Other forms of scoliosis, thoracic region: Secondary | ICD-10-CM | POA: Diagnosis not present

## 2015-04-26 DIAGNOSIS — Z9049 Acquired absence of other specified parts of digestive tract: Secondary | ICD-10-CM | POA: Diagnosis not present

## 2015-04-26 DIAGNOSIS — M859 Disorder of bone density and structure, unspecified: Secondary | ICD-10-CM | POA: Diagnosis not present

## 2015-04-26 DIAGNOSIS — M419 Scoliosis, unspecified: Secondary | ICD-10-CM | POA: Diagnosis not present

## 2015-04-26 DIAGNOSIS — M4326 Fusion of spine, lumbar region: Secondary | ICD-10-CM | POA: Diagnosis not present

## 2015-04-27 DIAGNOSIS — M79672 Pain in left foot: Secondary | ICD-10-CM | POA: Diagnosis not present

## 2015-04-27 DIAGNOSIS — H6093 Unspecified otitis externa, bilateral: Secondary | ICD-10-CM | POA: Diagnosis not present

## 2015-05-04 DIAGNOSIS — M419 Scoliosis, unspecified: Secondary | ICD-10-CM | POA: Diagnosis not present

## 2015-05-04 DIAGNOSIS — C50919 Malignant neoplasm of unspecified site of unspecified female breast: Secondary | ICD-10-CM | POA: Diagnosis not present

## 2015-05-04 DIAGNOSIS — M85851 Other specified disorders of bone density and structure, right thigh: Secondary | ICD-10-CM | POA: Diagnosis not present

## 2015-05-04 DIAGNOSIS — E039 Hypothyroidism, unspecified: Secondary | ICD-10-CM | POA: Diagnosis not present

## 2015-05-04 DIAGNOSIS — E119 Type 2 diabetes mellitus without complications: Secondary | ICD-10-CM | POA: Diagnosis not present

## 2015-05-04 DIAGNOSIS — Z79899 Other long term (current) drug therapy: Secondary | ICD-10-CM | POA: Diagnosis not present

## 2015-05-04 DIAGNOSIS — M859 Disorder of bone density and structure, unspecified: Secondary | ICD-10-CM | POA: Diagnosis not present

## 2015-05-04 DIAGNOSIS — Z780 Asymptomatic menopausal state: Secondary | ICD-10-CM | POA: Diagnosis not present

## 2015-05-10 DIAGNOSIS — M419 Scoliosis, unspecified: Secondary | ICD-10-CM | POA: Diagnosis not present

## 2015-05-10 DIAGNOSIS — M859 Disorder of bone density and structure, unspecified: Secondary | ICD-10-CM | POA: Diagnosis not present

## 2015-05-10 DIAGNOSIS — M47817 Spondylosis without myelopathy or radiculopathy, lumbosacral region: Secondary | ICD-10-CM | POA: Diagnosis not present

## 2015-05-26 DIAGNOSIS — M4806 Spinal stenosis, lumbar region: Secondary | ICD-10-CM | POA: Diagnosis not present

## 2015-05-26 DIAGNOSIS — Z981 Arthrodesis status: Secondary | ICD-10-CM | POA: Diagnosis not present

## 2015-05-26 DIAGNOSIS — M419 Scoliosis, unspecified: Secondary | ICD-10-CM | POA: Diagnosis not present

## 2015-05-26 DIAGNOSIS — M47817 Spondylosis without myelopathy or radiculopathy, lumbosacral region: Secondary | ICD-10-CM | POA: Diagnosis not present

## 2015-05-26 DIAGNOSIS — M4804 Spinal stenosis, thoracic region: Secondary | ICD-10-CM | POA: Diagnosis not present

## 2015-05-26 DIAGNOSIS — R935 Abnormal findings on diagnostic imaging of other abdominal regions, including retroperitoneum: Secondary | ICD-10-CM | POA: Diagnosis not present

## 2015-05-26 DIAGNOSIS — R911 Solitary pulmonary nodule: Secondary | ICD-10-CM | POA: Diagnosis not present

## 2015-05-30 DIAGNOSIS — M419 Scoliosis, unspecified: Secondary | ICD-10-CM | POA: Diagnosis not present

## 2015-05-30 DIAGNOSIS — M47817 Spondylosis without myelopathy or radiculopathy, lumbosacral region: Secondary | ICD-10-CM | POA: Diagnosis not present

## 2015-05-30 DIAGNOSIS — M859 Disorder of bone density and structure, unspecified: Secondary | ICD-10-CM | POA: Diagnosis not present

## 2015-05-31 DIAGNOSIS — E119 Type 2 diabetes mellitus without complications: Secondary | ICD-10-CM | POA: Diagnosis not present

## 2015-05-31 DIAGNOSIS — R739 Hyperglycemia, unspecified: Secondary | ICD-10-CM | POA: Diagnosis not present

## 2015-06-21 DIAGNOSIS — E039 Hypothyroidism, unspecified: Secondary | ICD-10-CM | POA: Diagnosis not present

## 2015-06-21 DIAGNOSIS — E119 Type 2 diabetes mellitus without complications: Secondary | ICD-10-CM | POA: Diagnosis not present

## 2015-06-27 DIAGNOSIS — I10 Essential (primary) hypertension: Secondary | ICD-10-CM | POA: Diagnosis not present

## 2015-06-27 DIAGNOSIS — E039 Hypothyroidism, unspecified: Secondary | ICD-10-CM | POA: Diagnosis not present

## 2015-06-27 DIAGNOSIS — E1165 Type 2 diabetes mellitus with hyperglycemia: Secondary | ICD-10-CM | POA: Diagnosis not present

## 2015-06-27 DIAGNOSIS — G894 Chronic pain syndrome: Secondary | ICD-10-CM | POA: Diagnosis not present

## 2015-06-29 DIAGNOSIS — H04123 Dry eye syndrome of bilateral lacrimal glands: Secondary | ICD-10-CM | POA: Diagnosis not present

## 2015-07-04 DIAGNOSIS — E119 Type 2 diabetes mellitus without complications: Secondary | ICD-10-CM | POA: Diagnosis not present

## 2015-07-04 DIAGNOSIS — S90561A Insect bite (nonvenomous), right ankle, initial encounter: Secondary | ICD-10-CM | POA: Diagnosis not present

## 2015-07-04 DIAGNOSIS — Z853 Personal history of malignant neoplasm of breast: Secondary | ICD-10-CM | POA: Diagnosis not present

## 2015-07-04 DIAGNOSIS — Z7984 Long term (current) use of oral hypoglycemic drugs: Secondary | ICD-10-CM | POA: Diagnosis not present

## 2015-07-04 DIAGNOSIS — Z79899 Other long term (current) drug therapy: Secondary | ICD-10-CM | POA: Diagnosis not present

## 2015-07-04 DIAGNOSIS — S90861A Insect bite (nonvenomous), right foot, initial encounter: Secondary | ICD-10-CM | POA: Diagnosis not present

## 2015-07-04 DIAGNOSIS — W57XXXA Bitten or stung by nonvenomous insect and other nonvenomous arthropods, initial encounter: Secondary | ICD-10-CM | POA: Diagnosis not present

## 2015-07-04 DIAGNOSIS — Z8542 Personal history of malignant neoplasm of other parts of uterus: Secondary | ICD-10-CM | POA: Diagnosis not present

## 2015-07-04 DIAGNOSIS — S90562A Insect bite (nonvenomous), left ankle, initial encounter: Secondary | ICD-10-CM | POA: Diagnosis not present

## 2015-07-04 DIAGNOSIS — S90862A Insect bite (nonvenomous), left foot, initial encounter: Secondary | ICD-10-CM | POA: Diagnosis not present

## 2015-07-19 DIAGNOSIS — M47816 Spondylosis without myelopathy or radiculopathy, lumbar region: Secondary | ICD-10-CM | POA: Diagnosis not present

## 2015-07-19 DIAGNOSIS — M1990 Unspecified osteoarthritis, unspecified site: Secondary | ICD-10-CM | POA: Diagnosis not present

## 2015-07-19 DIAGNOSIS — E119 Type 2 diabetes mellitus without complications: Secondary | ICD-10-CM | POA: Diagnosis not present

## 2015-07-19 DIAGNOSIS — Z01818 Encounter for other preprocedural examination: Secondary | ICD-10-CM | POA: Diagnosis not present

## 2015-07-19 DIAGNOSIS — F419 Anxiety disorder, unspecified: Secondary | ICD-10-CM | POA: Diagnosis not present

## 2015-07-19 DIAGNOSIS — R918 Other nonspecific abnormal finding of lung field: Secondary | ICD-10-CM | POA: Diagnosis not present

## 2015-07-19 DIAGNOSIS — Z79899 Other long term (current) drug therapy: Secondary | ICD-10-CM | POA: Diagnosis not present

## 2015-07-19 DIAGNOSIS — Z7984 Long term (current) use of oral hypoglycemic drugs: Secondary | ICD-10-CM | POA: Diagnosis not present

## 2015-07-31 DIAGNOSIS — M4186 Other forms of scoliosis, lumbar region: Secondary | ICD-10-CM | POA: Diagnosis not present

## 2015-07-31 DIAGNOSIS — Z981 Arthrodesis status: Secondary | ICD-10-CM | POA: Diagnosis not present

## 2015-07-31 DIAGNOSIS — M47816 Spondylosis without myelopathy or radiculopathy, lumbar region: Secondary | ICD-10-CM | POA: Diagnosis not present

## 2015-07-31 DIAGNOSIS — I10 Essential (primary) hypertension: Secondary | ICD-10-CM | POA: Diagnosis not present

## 2015-07-31 DIAGNOSIS — Z881 Allergy status to other antibiotic agents status: Secondary | ICD-10-CM | POA: Diagnosis not present

## 2015-07-31 DIAGNOSIS — Z853 Personal history of malignant neoplasm of breast: Secondary | ICD-10-CM | POA: Diagnosis not present

## 2015-07-31 DIAGNOSIS — M4806 Spinal stenosis, lumbar region: Secondary | ICD-10-CM | POA: Diagnosis not present

## 2015-07-31 DIAGNOSIS — M859 Disorder of bone density and structure, unspecified: Secondary | ICD-10-CM | POA: Diagnosis not present

## 2015-07-31 DIAGNOSIS — Z79891 Long term (current) use of opiate analgesic: Secondary | ICD-10-CM | POA: Diagnosis not present

## 2015-07-31 DIAGNOSIS — Z79899 Other long term (current) drug therapy: Secondary | ICD-10-CM | POA: Diagnosis not present

## 2015-07-31 DIAGNOSIS — E039 Hypothyroidism, unspecified: Secondary | ICD-10-CM | POA: Diagnosis not present

## 2015-07-31 DIAGNOSIS — Z91041 Radiographic dye allergy status: Secondary | ICD-10-CM | POA: Diagnosis not present

## 2015-07-31 DIAGNOSIS — Y92239 Unspecified place in hospital as the place of occurrence of the external cause: Secondary | ICD-10-CM | POA: Diagnosis not present

## 2015-07-31 DIAGNOSIS — M419 Scoliosis, unspecified: Secondary | ICD-10-CM | POA: Diagnosis not present

## 2015-07-31 DIAGNOSIS — Z8673 Personal history of transient ischemic attack (TIA), and cerebral infarction without residual deficits: Secondary | ICD-10-CM | POA: Diagnosis not present

## 2015-07-31 DIAGNOSIS — Z794 Long term (current) use of insulin: Secondary | ICD-10-CM | POA: Diagnosis not present

## 2015-07-31 DIAGNOSIS — E119 Type 2 diabetes mellitus without complications: Secondary | ICD-10-CM | POA: Diagnosis not present

## 2015-07-31 DIAGNOSIS — M47817 Spondylosis without myelopathy or radiculopathy, lumbosacral region: Secondary | ICD-10-CM | POA: Diagnosis not present

## 2015-07-31 DIAGNOSIS — Z887 Allergy status to serum and vaccine status: Secondary | ICD-10-CM | POA: Diagnosis not present

## 2015-07-31 DIAGNOSIS — M4326 Fusion of spine, lumbar region: Secondary | ICD-10-CM | POA: Diagnosis not present

## 2015-07-31 DIAGNOSIS — T398X5A Adverse effect of other nonopioid analgesics and antipyretics, not elsewhere classified, initial encounter: Secondary | ICD-10-CM | POA: Diagnosis not present

## 2015-07-31 DIAGNOSIS — R410 Disorientation, unspecified: Secondary | ICD-10-CM | POA: Diagnosis not present

## 2015-08-01 DIAGNOSIS — I10 Essential (primary) hypertension: Secondary | ICD-10-CM | POA: Diagnosis not present

## 2015-08-01 DIAGNOSIS — E139 Other specified diabetes mellitus without complications: Secondary | ICD-10-CM | POA: Diagnosis not present

## 2015-08-01 DIAGNOSIS — M4306 Spondylolysis, lumbar region: Secondary | ICD-10-CM | POA: Diagnosis not present

## 2015-08-02 DIAGNOSIS — E139 Other specified diabetes mellitus without complications: Secondary | ICD-10-CM | POA: Diagnosis not present

## 2015-08-02 DIAGNOSIS — I10 Essential (primary) hypertension: Secondary | ICD-10-CM | POA: Diagnosis not present

## 2015-08-02 DIAGNOSIS — M4306 Spondylolysis, lumbar region: Secondary | ICD-10-CM | POA: Diagnosis not present

## 2015-08-03 DIAGNOSIS — M4306 Spondylolysis, lumbar region: Secondary | ICD-10-CM | POA: Diagnosis not present

## 2015-08-03 DIAGNOSIS — I10 Essential (primary) hypertension: Secondary | ICD-10-CM | POA: Diagnosis not present

## 2015-08-03 DIAGNOSIS — E139 Other specified diabetes mellitus without complications: Secondary | ICD-10-CM | POA: Diagnosis not present

## 2015-08-04 DIAGNOSIS — M4186 Other forms of scoliosis, lumbar region: Secondary | ICD-10-CM | POA: Diagnosis not present

## 2015-08-04 DIAGNOSIS — M47816 Spondylosis without myelopathy or radiculopathy, lumbar region: Secondary | ICD-10-CM | POA: Diagnosis not present

## 2015-08-04 DIAGNOSIS — Z981 Arthrodesis status: Secondary | ICD-10-CM | POA: Diagnosis not present

## 2015-08-04 DIAGNOSIS — E119 Type 2 diabetes mellitus without complications: Secondary | ICD-10-CM | POA: Diagnosis not present

## 2015-08-04 DIAGNOSIS — M4806 Spinal stenosis, lumbar region: Secondary | ICD-10-CM | POA: Diagnosis not present

## 2015-08-07 DIAGNOSIS — I10 Essential (primary) hypertension: Secondary | ICD-10-CM | POA: Diagnosis not present

## 2015-08-07 DIAGNOSIS — M6281 Muscle weakness (generalized): Secondary | ICD-10-CM | POA: Diagnosis not present

## 2015-08-07 DIAGNOSIS — R2689 Other abnormalities of gait and mobility: Secondary | ICD-10-CM | POA: Diagnosis not present

## 2015-08-07 DIAGNOSIS — E119 Type 2 diabetes mellitus without complications: Secondary | ICD-10-CM | POA: Diagnosis not present

## 2015-08-07 DIAGNOSIS — K5900 Constipation, unspecified: Secondary | ICD-10-CM | POA: Diagnosis not present

## 2015-08-07 DIAGNOSIS — M4307 Spondylolysis, lumbosacral region: Secondary | ICD-10-CM | POA: Diagnosis not present

## 2015-08-07 DIAGNOSIS — E038 Other specified hypothyroidism: Secondary | ICD-10-CM | POA: Diagnosis not present

## 2015-08-07 DIAGNOSIS — M4806 Spinal stenosis, lumbar region: Secondary | ICD-10-CM | POA: Diagnosis not present

## 2015-08-07 DIAGNOSIS — E039 Hypothyroidism, unspecified: Secondary | ICD-10-CM | POA: Diagnosis not present

## 2015-08-07 DIAGNOSIS — Z981 Arthrodesis status: Secondary | ICD-10-CM | POA: Diagnosis not present

## 2015-08-07 DIAGNOSIS — M4186 Other forms of scoliosis, lumbar region: Secondary | ICD-10-CM | POA: Diagnosis not present

## 2015-08-12 DIAGNOSIS — Z7984 Long term (current) use of oral hypoglycemic drugs: Secondary | ICD-10-CM | POA: Diagnosis not present

## 2015-08-12 DIAGNOSIS — Z8542 Personal history of malignant neoplasm of other parts of uterus: Secondary | ICD-10-CM | POA: Diagnosis not present

## 2015-08-12 DIAGNOSIS — Z853 Personal history of malignant neoplasm of breast: Secondary | ICD-10-CM | POA: Diagnosis not present

## 2015-08-12 DIAGNOSIS — E039 Hypothyroidism, unspecified: Secondary | ICD-10-CM | POA: Diagnosis not present

## 2015-08-12 DIAGNOSIS — R32 Unspecified urinary incontinence: Secondary | ICD-10-CM | POA: Diagnosis not present

## 2015-08-12 DIAGNOSIS — M4126 Other idiopathic scoliosis, lumbar region: Secondary | ICD-10-CM | POA: Diagnosis not present

## 2015-08-12 DIAGNOSIS — E119 Type 2 diabetes mellitus without complications: Secondary | ICD-10-CM | POA: Diagnosis not present

## 2015-08-12 DIAGNOSIS — M4806 Spinal stenosis, lumbar region: Secondary | ICD-10-CM | POA: Diagnosis not present

## 2015-08-12 DIAGNOSIS — Z4789 Encounter for other orthopedic aftercare: Secondary | ICD-10-CM | POA: Diagnosis not present

## 2015-08-29 DIAGNOSIS — M47816 Spondylosis without myelopathy or radiculopathy, lumbar region: Secondary | ICD-10-CM | POA: Diagnosis not present

## 2015-08-29 DIAGNOSIS — M47817 Spondylosis without myelopathy or radiculopathy, lumbosacral region: Secondary | ICD-10-CM | POA: Diagnosis not present

## 2015-08-29 DIAGNOSIS — Z981 Arthrodesis status: Secondary | ICD-10-CM | POA: Diagnosis not present

## 2015-09-12 DIAGNOSIS — R350 Frequency of micturition: Secondary | ICD-10-CM | POA: Diagnosis not present

## 2015-09-12 DIAGNOSIS — M4806 Spinal stenosis, lumbar region: Secondary | ICD-10-CM | POA: Diagnosis not present

## 2015-11-07 DIAGNOSIS — E119 Type 2 diabetes mellitus without complications: Secondary | ICD-10-CM | POA: Diagnosis not present

## 2015-11-12 IMAGING — DX DG LUMBAR SPINE 2-3V
2 series · 2 of 2 positions shown · non-contrast
Comparison: April 21, 2014

CLINICAL DATA: Postoperative lumbar fusion

EXAM:
LUMBAR SPINE - 2-3 VIEW

[l-spine ap]
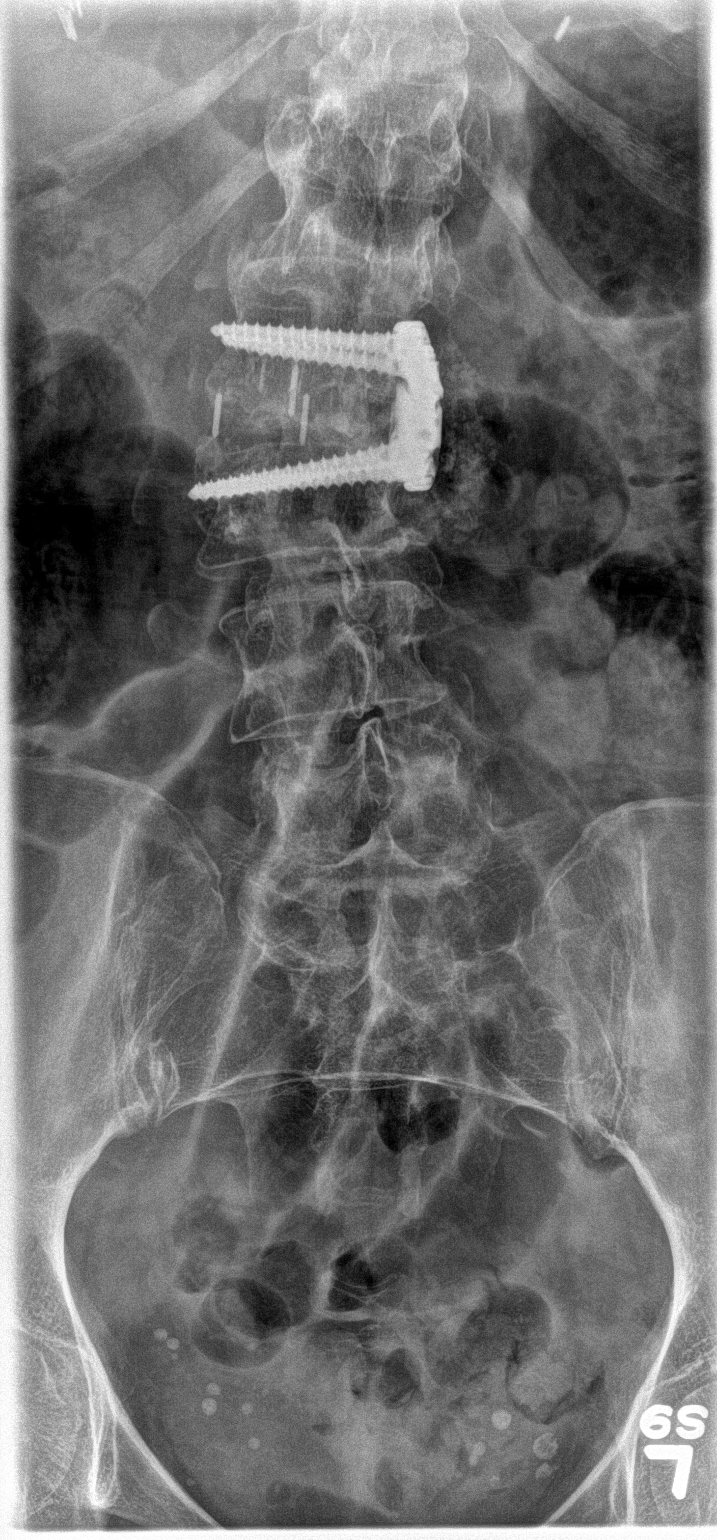

[l-spine lat]
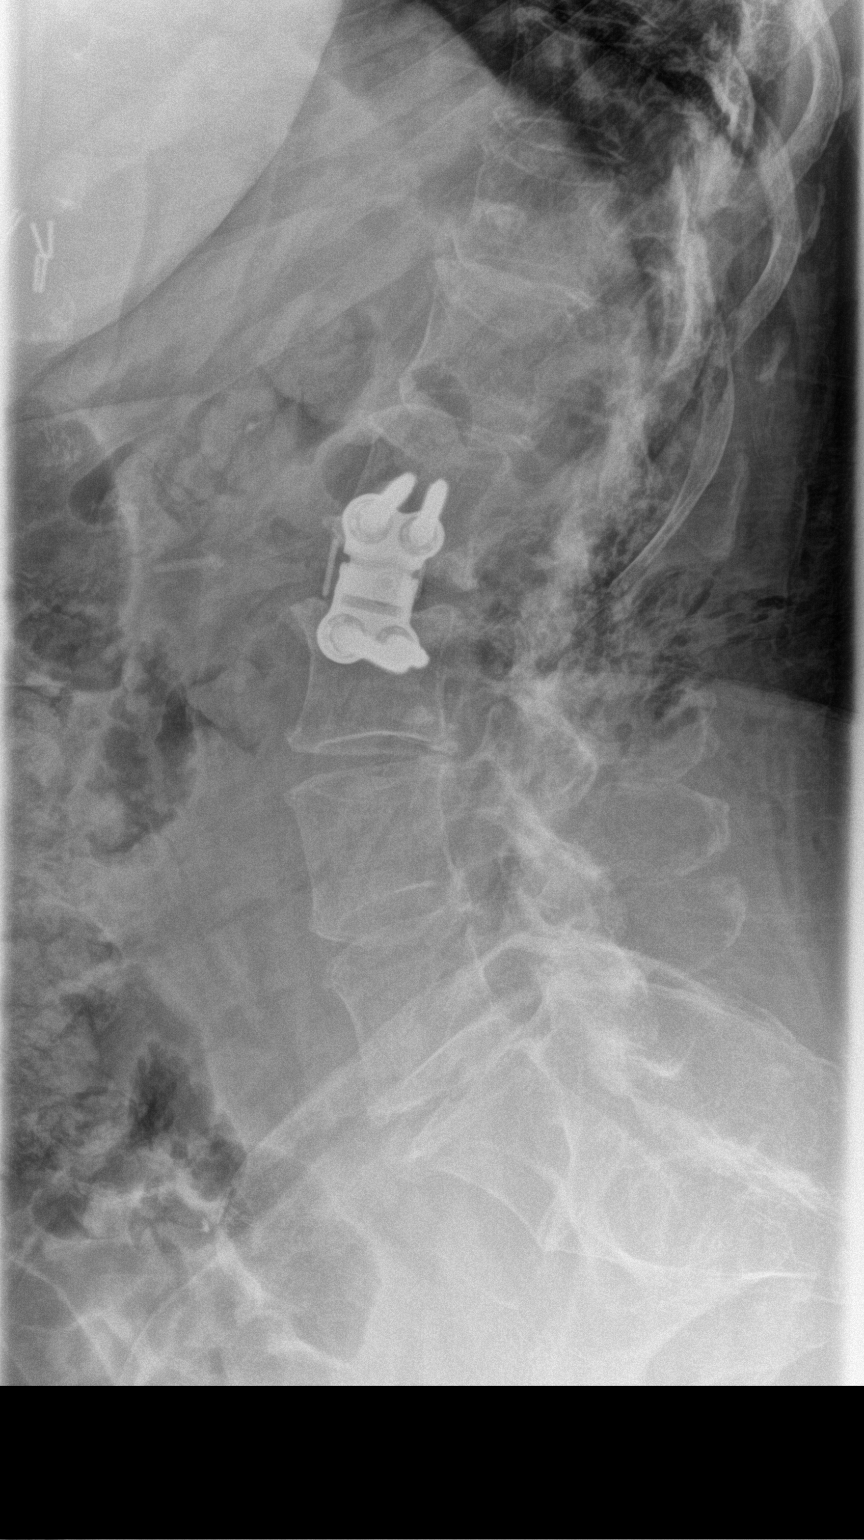

[2 of 2 positions shown; findings below may reference images not displayed]

FINDINGS: Frontal and lateral views were obtained. There is screw and plate
fixation extending through the fell to at L3 vertebral bodies from a
leftward approach. There is a disc spacer at L2-3. Screw tips are
slightly beyond the vertebral bodies on the right. There is no
fracture or spondylolisthesis. There is dextroscoliosis. There is
disc space narrowing to varying degrees at all levels.
IMPRESSION: Postoperative change at L2 and L3 with the screw and plate fixation
device and disc spacer appearing intact. No fracture or
spondylolisthesis. Scoliosis and multilevel osteoarthritic change.

## 2015-11-13 DIAGNOSIS — E039 Hypothyroidism, unspecified: Secondary | ICD-10-CM | POA: Diagnosis not present

## 2015-11-13 DIAGNOSIS — E119 Type 2 diabetes mellitus without complications: Secondary | ICD-10-CM | POA: Diagnosis not present

## 2015-11-17 DIAGNOSIS — G894 Chronic pain syndrome: Secondary | ICD-10-CM | POA: Diagnosis not present

## 2015-11-17 DIAGNOSIS — G589 Mononeuropathy, unspecified: Secondary | ICD-10-CM | POA: Diagnosis not present

## 2015-11-17 DIAGNOSIS — F411 Generalized anxiety disorder: Secondary | ICD-10-CM | POA: Diagnosis not present

## 2015-11-17 DIAGNOSIS — I10 Essential (primary) hypertension: Secondary | ICD-10-CM | POA: Diagnosis not present

## 2015-11-17 DIAGNOSIS — E039 Hypothyroidism, unspecified: Secondary | ICD-10-CM | POA: Diagnosis not present

## 2015-11-17 DIAGNOSIS — Z23 Encounter for immunization: Secondary | ICD-10-CM | POA: Diagnosis not present

## 2015-11-17 DIAGNOSIS — E1165 Type 2 diabetes mellitus with hyperglycemia: Secondary | ICD-10-CM | POA: Diagnosis not present

## 2015-11-17 DIAGNOSIS — Z0001 Encounter for general adult medical examination with abnormal findings: Secondary | ICD-10-CM | POA: Diagnosis not present

## 2015-12-26 DIAGNOSIS — M419 Scoliosis, unspecified: Secondary | ICD-10-CM | POA: Diagnosis not present

## 2015-12-26 DIAGNOSIS — M47817 Spondylosis without myelopathy or radiculopathy, lumbosacral region: Secondary | ICD-10-CM | POA: Diagnosis not present

## 2016-01-05 DIAGNOSIS — J019 Acute sinusitis, unspecified: Secondary | ICD-10-CM | POA: Diagnosis not present

## 2016-03-12 DIAGNOSIS — E039 Hypothyroidism, unspecified: Secondary | ICD-10-CM | POA: Diagnosis not present

## 2016-03-12 DIAGNOSIS — E119 Type 2 diabetes mellitus without complications: Secondary | ICD-10-CM | POA: Diagnosis not present

## 2016-03-12 DIAGNOSIS — E782 Mixed hyperlipidemia: Secondary | ICD-10-CM | POA: Diagnosis not present

## 2016-03-15 DIAGNOSIS — I10 Essential (primary) hypertension: Secondary | ICD-10-CM | POA: Diagnosis not present

## 2016-03-15 DIAGNOSIS — G894 Chronic pain syndrome: Secondary | ICD-10-CM | POA: Diagnosis not present

## 2016-03-15 DIAGNOSIS — E039 Hypothyroidism, unspecified: Secondary | ICD-10-CM | POA: Diagnosis not present

## 2016-03-15 DIAGNOSIS — F411 Generalized anxiety disorder: Secondary | ICD-10-CM | POA: Diagnosis not present

## 2016-03-15 DIAGNOSIS — E1165 Type 2 diabetes mellitus with hyperglycemia: Secondary | ICD-10-CM | POA: Diagnosis not present

## 2016-03-27 DIAGNOSIS — M47817 Spondylosis without myelopathy or radiculopathy, lumbosacral region: Secondary | ICD-10-CM | POA: Diagnosis not present

## 2016-04-09 DIAGNOSIS — X32XXXA Exposure to sunlight, initial encounter: Secondary | ICD-10-CM | POA: Diagnosis not present

## 2016-04-09 DIAGNOSIS — L570 Actinic keratosis: Secondary | ICD-10-CM | POA: Diagnosis not present

## 2016-04-09 DIAGNOSIS — Z85828 Personal history of other malignant neoplasm of skin: Secondary | ICD-10-CM | POA: Diagnosis not present

## 2016-04-09 DIAGNOSIS — Z08 Encounter for follow-up examination after completed treatment for malignant neoplasm: Secondary | ICD-10-CM | POA: Diagnosis not present

## 2016-04-09 DIAGNOSIS — C44311 Basal cell carcinoma of skin of nose: Secondary | ICD-10-CM | POA: Diagnosis not present

## 2016-04-09 DIAGNOSIS — L820 Inflamed seborrheic keratosis: Secondary | ICD-10-CM | POA: Diagnosis not present

## 2016-04-09 DIAGNOSIS — L821 Other seborrheic keratosis: Secondary | ICD-10-CM | POA: Diagnosis not present

## 2016-05-10 DIAGNOSIS — H04123 Dry eye syndrome of bilateral lacrimal glands: Secondary | ICD-10-CM | POA: Diagnosis not present

## 2016-05-10 DIAGNOSIS — H26491 Other secondary cataract, right eye: Secondary | ICD-10-CM | POA: Diagnosis not present

## 2016-05-10 DIAGNOSIS — E119 Type 2 diabetes mellitus without complications: Secondary | ICD-10-CM | POA: Diagnosis not present

## 2016-05-10 DIAGNOSIS — Z961 Presence of intraocular lens: Secondary | ICD-10-CM | POA: Diagnosis not present

## 2016-05-13 DIAGNOSIS — R350 Frequency of micturition: Secondary | ICD-10-CM | POA: Diagnosis not present

## 2016-05-13 DIAGNOSIS — F39 Unspecified mood [affective] disorder: Secondary | ICD-10-CM | POA: Diagnosis not present

## 2016-05-13 DIAGNOSIS — Z9181 History of falling: Secondary | ICD-10-CM | POA: Diagnosis not present

## 2016-05-20 DIAGNOSIS — Z85828 Personal history of other malignant neoplasm of skin: Secondary | ICD-10-CM | POA: Diagnosis not present

## 2016-05-20 DIAGNOSIS — Z08 Encounter for follow-up examination after completed treatment for malignant neoplasm: Secondary | ICD-10-CM | POA: Diagnosis not present

## 2016-05-30 DIAGNOSIS — Z981 Arthrodesis status: Secondary | ICD-10-CM | POA: Diagnosis not present

## 2016-05-30 DIAGNOSIS — M545 Low back pain: Secondary | ICD-10-CM | POA: Diagnosis not present

## 2016-05-30 DIAGNOSIS — M25551 Pain in right hip: Secondary | ICD-10-CM | POA: Diagnosis not present

## 2016-05-30 DIAGNOSIS — M419 Scoliosis, unspecified: Secondary | ICD-10-CM | POA: Diagnosis not present

## 2016-05-30 DIAGNOSIS — S79911A Unspecified injury of right hip, initial encounter: Secondary | ICD-10-CM | POA: Diagnosis not present

## 2016-05-30 DIAGNOSIS — W19XXXA Unspecified fall, initial encounter: Secondary | ICD-10-CM | POA: Diagnosis not present

## 2016-05-30 DIAGNOSIS — S3992XA Unspecified injury of lower back, initial encounter: Secondary | ICD-10-CM | POA: Diagnosis not present

## 2016-05-30 DIAGNOSIS — M47817 Spondylosis without myelopathy or radiculopathy, lumbosacral region: Secondary | ICD-10-CM | POA: Diagnosis not present

## 2016-06-12 DIAGNOSIS — G894 Chronic pain syndrome: Secondary | ICD-10-CM | POA: Diagnosis not present

## 2016-06-12 DIAGNOSIS — Z6832 Body mass index (BMI) 32.0-32.9, adult: Secondary | ICD-10-CM | POA: Diagnosis not present

## 2016-06-12 DIAGNOSIS — F330 Major depressive disorder, recurrent, mild: Secondary | ICD-10-CM | POA: Diagnosis not present

## 2016-06-26 DIAGNOSIS — M419 Scoliosis, unspecified: Secondary | ICD-10-CM | POA: Diagnosis not present

## 2016-06-26 DIAGNOSIS — M47817 Spondylosis without myelopathy or radiculopathy, lumbosacral region: Secondary | ICD-10-CM | POA: Diagnosis not present

## 2016-06-26 DIAGNOSIS — M859 Disorder of bone density and structure, unspecified: Secondary | ICD-10-CM | POA: Diagnosis not present

## 2016-07-22 DIAGNOSIS — R296 Repeated falls: Secondary | ICD-10-CM | POA: Diagnosis not present

## 2016-07-22 DIAGNOSIS — R531 Weakness: Secondary | ICD-10-CM | POA: Diagnosis not present

## 2016-07-22 DIAGNOSIS — Z6832 Body mass index (BMI) 32.0-32.9, adult: Secondary | ICD-10-CM | POA: Diagnosis not present

## 2016-08-12 DIAGNOSIS — S8265XA Nondisplaced fracture of lateral malleolus of left fibula, initial encounter for closed fracture: Secondary | ICD-10-CM | POA: Diagnosis not present

## 2016-08-12 DIAGNOSIS — M79672 Pain in left foot: Secondary | ICD-10-CM | POA: Diagnosis not present

## 2016-08-12 DIAGNOSIS — W1839XA Other fall on same level, initial encounter: Secondary | ICD-10-CM | POA: Diagnosis not present

## 2016-08-12 DIAGNOSIS — S8262XA Displaced fracture of lateral malleolus of left fibula, initial encounter for closed fracture: Secondary | ICD-10-CM | POA: Diagnosis not present

## 2016-08-12 DIAGNOSIS — R404 Transient alteration of awareness: Secondary | ICD-10-CM | POA: Diagnosis not present

## 2016-08-12 DIAGNOSIS — S82831A Other fracture of upper and lower end of right fibula, initial encounter for closed fracture: Secondary | ICD-10-CM | POA: Diagnosis not present

## 2016-08-12 DIAGNOSIS — S82832A Other fracture of upper and lower end of left fibula, initial encounter for closed fracture: Secondary | ICD-10-CM | POA: Diagnosis not present

## 2016-08-12 DIAGNOSIS — M79671 Pain in right foot: Secondary | ICD-10-CM | POA: Diagnosis not present

## 2016-08-12 DIAGNOSIS — S8264XA Nondisplaced fracture of lateral malleolus of right fibula, initial encounter for closed fracture: Secondary | ICD-10-CM | POA: Diagnosis not present

## 2016-08-12 DIAGNOSIS — R531 Weakness: Secondary | ICD-10-CM | POA: Diagnosis not present

## 2016-08-13 DIAGNOSIS — S8264XA Nondisplaced fracture of lateral malleolus of right fibula, initial encounter for closed fracture: Secondary | ICD-10-CM | POA: Diagnosis not present

## 2016-08-13 DIAGNOSIS — E039 Hypothyroidism, unspecified: Secondary | ICD-10-CM | POA: Diagnosis not present

## 2016-08-13 DIAGNOSIS — G4700 Insomnia, unspecified: Secondary | ICD-10-CM | POA: Diagnosis not present

## 2016-08-13 DIAGNOSIS — Z8542 Personal history of malignant neoplasm of other parts of uterus: Secondary | ICD-10-CM | POA: Diagnosis not present

## 2016-08-13 DIAGNOSIS — M545 Low back pain: Secondary | ICD-10-CM | POA: Diagnosis not present

## 2016-08-13 DIAGNOSIS — F329 Major depressive disorder, single episode, unspecified: Secondary | ICD-10-CM | POA: Diagnosis not present

## 2016-08-13 DIAGNOSIS — Z658 Other specified problems related to psychosocial circumstances: Secondary | ICD-10-CM | POA: Diagnosis not present

## 2016-08-13 DIAGNOSIS — I4891 Unspecified atrial fibrillation: Secondary | ICD-10-CM | POA: Diagnosis not present

## 2016-08-13 DIAGNOSIS — F418 Other specified anxiety disorders: Secondary | ICD-10-CM | POA: Diagnosis not present

## 2016-08-13 DIAGNOSIS — Z887 Allergy status to serum and vaccine status: Secondary | ICD-10-CM | POA: Diagnosis not present

## 2016-08-13 DIAGNOSIS — R279 Unspecified lack of coordination: Secondary | ICD-10-CM | POA: Diagnosis not present

## 2016-08-13 DIAGNOSIS — F419 Anxiety disorder, unspecified: Secondary | ICD-10-CM | POA: Diagnosis not present

## 2016-08-13 DIAGNOSIS — M6281 Muscle weakness (generalized): Secondary | ICD-10-CM | POA: Diagnosis not present

## 2016-08-13 DIAGNOSIS — Z881 Allergy status to other antibiotic agents status: Secondary | ICD-10-CM | POA: Diagnosis not present

## 2016-08-13 DIAGNOSIS — M4306 Spondylolysis, lumbar region: Secondary | ICD-10-CM | POA: Diagnosis not present

## 2016-08-13 DIAGNOSIS — Z7401 Bed confinement status: Secondary | ICD-10-CM | POA: Diagnosis not present

## 2016-08-13 DIAGNOSIS — M47819 Spondylosis without myelopathy or radiculopathy, site unspecified: Secondary | ICD-10-CM | POA: Diagnosis not present

## 2016-08-13 DIAGNOSIS — M84471D Pathological fracture, right ankle, subsequent encounter for fracture with routine healing: Secondary | ICD-10-CM | POA: Diagnosis not present

## 2016-08-13 DIAGNOSIS — Z853 Personal history of malignant neoplasm of breast: Secondary | ICD-10-CM | POA: Diagnosis not present

## 2016-08-13 DIAGNOSIS — I10 Essential (primary) hypertension: Secondary | ICD-10-CM | POA: Diagnosis not present

## 2016-08-13 DIAGNOSIS — Z91041 Radiographic dye allergy status: Secondary | ICD-10-CM | POA: Diagnosis not present

## 2016-08-13 DIAGNOSIS — W1839XA Other fall on same level, initial encounter: Secondary | ICD-10-CM | POA: Diagnosis not present

## 2016-08-13 DIAGNOSIS — S8265XA Nondisplaced fracture of lateral malleolus of left fibula, initial encounter for closed fracture: Secondary | ICD-10-CM | POA: Diagnosis not present

## 2016-08-13 DIAGNOSIS — R262 Difficulty in walking, not elsewhere classified: Secondary | ICD-10-CM | POA: Diagnosis not present

## 2016-08-13 DIAGNOSIS — F411 Generalized anxiety disorder: Secondary | ICD-10-CM | POA: Diagnosis not present

## 2016-08-13 DIAGNOSIS — R278 Other lack of coordination: Secondary | ICD-10-CM | POA: Diagnosis not present

## 2016-08-13 DIAGNOSIS — Z8673 Personal history of transient ischemic attack (TIA), and cerebral infarction without residual deficits: Secondary | ICD-10-CM | POA: Diagnosis not present

## 2016-08-13 DIAGNOSIS — N390 Urinary tract infection, site not specified: Secondary | ICD-10-CM | POA: Diagnosis not present

## 2016-08-13 DIAGNOSIS — S8262XA Displaced fracture of lateral malleolus of left fibula, initial encounter for closed fracture: Secondary | ICD-10-CM | POA: Diagnosis not present

## 2016-08-13 DIAGNOSIS — E119 Type 2 diabetes mellitus without complications: Secondary | ICD-10-CM | POA: Diagnosis not present

## 2016-08-13 DIAGNOSIS — J019 Acute sinusitis, unspecified: Secondary | ICD-10-CM | POA: Diagnosis not present

## 2016-08-13 DIAGNOSIS — G8929 Other chronic pain: Secondary | ICD-10-CM | POA: Diagnosis not present

## 2016-08-13 DIAGNOSIS — L259 Unspecified contact dermatitis, unspecified cause: Secondary | ICD-10-CM | POA: Diagnosis not present

## 2016-08-13 DIAGNOSIS — M47896 Other spondylosis, lumbar region: Secondary | ICD-10-CM | POA: Diagnosis not present

## 2016-08-13 DIAGNOSIS — Z91048 Other nonmedicinal substance allergy status: Secondary | ICD-10-CM | POA: Diagnosis not present

## 2016-08-13 DIAGNOSIS — R32 Unspecified urinary incontinence: Secondary | ICD-10-CM | POA: Diagnosis not present

## 2016-08-13 DIAGNOSIS — Z7984 Long term (current) use of oral hypoglycemic drugs: Secondary | ICD-10-CM | POA: Diagnosis not present

## 2016-08-13 DIAGNOSIS — S82831A Other fracture of upper and lower end of right fibula, initial encounter for closed fracture: Secondary | ICD-10-CM | POA: Diagnosis not present

## 2016-08-13 DIAGNOSIS — E1140 Type 2 diabetes mellitus with diabetic neuropathy, unspecified: Secondary | ICD-10-CM | POA: Diagnosis not present

## 2016-08-13 DIAGNOSIS — Z79899 Other long term (current) drug therapy: Secondary | ICD-10-CM | POA: Diagnosis not present

## 2016-08-15 ENCOUNTER — Other Ambulatory Visit: Payer: Self-pay | Admitting: *Deleted

## 2016-08-15 NOTE — Patient Outreach (Signed)
[facility]) Care Management  08/15/2016  Vanessa Atkins 1934-09-09 [id number]   Transition of Care  Patient has been admitted to [facility] from 08/12/16 to present. She will be discharging to SNF rehab at discharge, confirmed by Lanice Shirts, [place] department.   Plan:  RN CM will send [facility] SW referral for possible admission to SNF rehab.  Lake Bells, RN, BSN, MHA/MSL, [place] Telephonic Care Manager Coordinator [facility] Direct Phone: [phone] Toll Free: [phone] Fax: [phone]

## 2016-08-16 ENCOUNTER — Encounter: Payer: Self-pay | Admitting: Licensed Clinical Social Worker

## 2016-08-16 ENCOUNTER — Other Ambulatory Visit: Payer: Self-pay | Admitting: Licensed Clinical Social Worker

## 2016-08-16 DIAGNOSIS — Z79899 Other long term (current) drug therapy: Secondary | ICD-10-CM | POA: Diagnosis not present

## 2016-08-16 DIAGNOSIS — F329 Major depressive disorder, single episode, unspecified: Secondary | ICD-10-CM | POA: Diagnosis not present

## 2016-08-16 DIAGNOSIS — G4700 Insomnia, unspecified: Secondary | ICD-10-CM | POA: Diagnosis not present

## 2016-08-16 DIAGNOSIS — I10 Essential (primary) hypertension: Secondary | ICD-10-CM | POA: Diagnosis not present

## 2016-08-16 DIAGNOSIS — R279 Unspecified lack of coordination: Secondary | ICD-10-CM | POA: Diagnosis not present

## 2016-08-16 DIAGNOSIS — R32 Unspecified urinary incontinence: Secondary | ICD-10-CM | POA: Diagnosis not present

## 2016-08-16 DIAGNOSIS — Z743 Need for continuous supervision: Secondary | ICD-10-CM | POA: Diagnosis not present

## 2016-08-16 DIAGNOSIS — S82492D Other fracture of shaft of left fibula, subsequent encounter for closed fracture with routine healing: Secondary | ICD-10-CM | POA: Diagnosis not present

## 2016-08-16 DIAGNOSIS — F411 Generalized anxiety disorder: Secondary | ICD-10-CM | POA: Diagnosis not present

## 2016-08-16 DIAGNOSIS — D518 Other vitamin B12 deficiency anemias: Secondary | ICD-10-CM | POA: Diagnosis not present

## 2016-08-16 DIAGNOSIS — E119 Type 2 diabetes mellitus without complications: Secondary | ICD-10-CM | POA: Diagnosis not present

## 2016-08-16 DIAGNOSIS — M545 Low back pain: Secondary | ICD-10-CM | POA: Diagnosis not present

## 2016-08-16 DIAGNOSIS — N390 Urinary tract infection, site not specified: Secondary | ICD-10-CM | POA: Diagnosis not present

## 2016-08-16 DIAGNOSIS — S82891D Other fracture of right lower leg, subsequent encounter for closed fracture with routine healing: Secondary | ICD-10-CM | POA: Diagnosis not present

## 2016-08-16 DIAGNOSIS — Z658 Other specified problems related to psychosocial circumstances: Secondary | ICD-10-CM | POA: Diagnosis not present

## 2016-08-16 DIAGNOSIS — S8264XA Nondisplaced fracture of lateral malleolus of right fibula, initial encounter for closed fracture: Secondary | ICD-10-CM | POA: Diagnosis not present

## 2016-08-16 DIAGNOSIS — F419 Anxiety disorder, unspecified: Secondary | ICD-10-CM | POA: Diagnosis not present

## 2016-08-16 DIAGNOSIS — M6281 Muscle weakness (generalized): Secondary | ICD-10-CM | POA: Diagnosis not present

## 2016-08-16 DIAGNOSIS — S82832A Other fracture of upper and lower end of left fibula, initial encounter for closed fracture: Secondary | ICD-10-CM | POA: Diagnosis not present

## 2016-08-16 DIAGNOSIS — E039 Hypothyroidism, unspecified: Secondary | ICD-10-CM | POA: Diagnosis not present

## 2016-08-16 DIAGNOSIS — R262 Difficulty in walking, not elsewhere classified: Secondary | ICD-10-CM | POA: Diagnosis not present

## 2016-08-16 DIAGNOSIS — F418 Other specified anxiety disorders: Secondary | ICD-10-CM | POA: Diagnosis not present

## 2016-08-16 DIAGNOSIS — S82831A Other fracture of upper and lower end of right fibula, initial encounter for closed fracture: Secondary | ICD-10-CM | POA: Diagnosis not present

## 2016-08-16 DIAGNOSIS — L259 Unspecified contact dermatitis, unspecified cause: Secondary | ICD-10-CM | POA: Diagnosis not present

## 2016-08-16 DIAGNOSIS — J019 Acute sinusitis, unspecified: Secondary | ICD-10-CM | POA: Diagnosis not present

## 2016-08-16 DIAGNOSIS — M84471D Pathological fracture, right ankle, subsequent encounter for fracture with routine healing: Secondary | ICD-10-CM | POA: Diagnosis not present

## 2016-08-16 DIAGNOSIS — M47819 Spondylosis without myelopathy or radiculopathy, site unspecified: Secondary | ICD-10-CM | POA: Diagnosis not present

## 2016-08-16 DIAGNOSIS — E1140 Type 2 diabetes mellitus with diabetic neuropathy, unspecified: Secondary | ICD-10-CM | POA: Diagnosis not present

## 2016-08-16 DIAGNOSIS — W1839XA Other fall on same level, initial encounter: Secondary | ICD-10-CM | POA: Diagnosis not present

## 2016-08-16 DIAGNOSIS — S8265XA Nondisplaced fracture of lateral malleolus of left fibula, initial encounter for closed fracture: Secondary | ICD-10-CM | POA: Diagnosis not present

## 2016-08-16 DIAGNOSIS — E782 Mixed hyperlipidemia: Secondary | ICD-10-CM | POA: Diagnosis not present

## 2016-08-16 DIAGNOSIS — E038 Other specified hypothyroidism: Secondary | ICD-10-CM | POA: Diagnosis not present

## 2016-08-16 DIAGNOSIS — S82491D Other fracture of shaft of right fibula, subsequent encounter for closed fracture with routine healing: Secondary | ICD-10-CM | POA: Diagnosis not present

## 2016-08-16 DIAGNOSIS — Z7401 Bed confinement status: Secondary | ICD-10-CM | POA: Diagnosis not present

## 2016-08-16 DIAGNOSIS — R278 Other lack of coordination: Secondary | ICD-10-CM | POA: Diagnosis not present

## 2016-08-16 DIAGNOSIS — M4306 Spondylolysis, lumbar region: Secondary | ICD-10-CM | POA: Diagnosis not present

## 2016-08-16 NOTE — Patient Outreach (Signed)
[facility]) Care Management  [facility] Social Work  08/16/2016  NIKITHA MODE 02-11-1934 [id number]  Subjective:    Objective:   Encounter Medications:  Outpatient Encounter Prescriptions as of 08/16/2016  Medication Sig Note  . alprazolam (XANAX) 2 MG tablet Take 2 mg by mouth at bedtime as needed for sleep.   Marland Kitchen docusate sodium (COLACE) 100 MG capsule Take 1 capsule (100 mg total) by mouth 3 (three) times daily as needed for mild constipation.   . gabapentin (NEURONTIN) 300 MG capsule Take 300-600 mg by mouth 3 (three) times daily. 06/27/2014: Taking as needed during day but takes '600mg'$  at night every night.     Marland Kitchen glipiZIDE (GLUCOTROL) 5 MG tablet Take 5 mg by mouth daily before breakfast.   . HYDROcodone-acetaminophen (NORCO) 10-325 MG per tablet Take 1 tablet by mouth every 6 (six) hours as needed.   . hydrocortisone valerate ointment (WEST-CORT) 0.2 % Apply 1 application topically 2 (two) times daily as needed (skin rash). 06/27/2014: Uses for bug bites as needed; currently not using.   Marland Kitchen levothyroxine (SYNTHROID, LEVOTHROID) 50 MCG tablet Take 50 mcg by mouth daily before breakfast.   . lisinopril (PRINIVIL,ZESTRIL) 10 MG tablet Take 10 mg by mouth daily.   . methocarbamol (ROBAXIN) 500 MG tablet Take 1 tablet (500 mg total) by mouth 3 (three) times daily as needed for muscle spasms. (Patient not taking: Reported on 06/27/2014)   . Multiple Vitamins-Minerals (MULTIVITAMIN WITH MINERALS) tablet Take 1 tablet by mouth daily.   . ondansetron (ZOFRAN) 4 MG tablet Take 1 tablet (4 mg total) by mouth every 8 (eight) hours as needed for nausea or vomiting. (Patient not taking: Reported on 06/27/2014)   . oxybutynin (DITROPAN) 5 MG tablet Take 5 mg by mouth daily.   . tamoxifen (NOLVADEX) 20 MG tablet Take 20 mg by mouth daily.    No facility-administered encounter medications on file as of 08/16/2016.     Functional Status:  No flowsheet data found.  Fall/Depression  Screening:  PHQ 2/9 Scores 08/05/2014 06/27/2014  PHQ - 2 Score 0 0    Assessment:   CSW received referral on Emilio Math. CSW completed chart review on client on 08/16/16. Client sees Dr. Nevada Crane as primary care doctor.  Client had been residing at home with her spouse. She recently had a fall at her home and was hospitalized at [facility] in [place], [place].  Client has fractured ankles as result of fall at her home. She did not have surgery at hospital. She is seeking skilled nursing facility placement for rehabilitation and nursing care.  CSW spoke with case manager at hospital and case manager said that FL-2 was being sent for client to several skilled nursing facilities for consideration.  Client has support from her spouse and from her son.  CSW traveled to [place] in [place], [place] on 08/16/16. CSW met with client on 08/16/16 at room of client at [facility]. [place] received verbal permission from client on 08/16/16 for CSW to speak with client about client needs and status.  Client said she had just been accepted to go to [facility] and Nursing in [place], [place]. Client said she was scheduled to discharge from [facility] on 08/16/16. She said that following her discharge from the hospital, she is scheduled to admit on 08/16/16 to [facility] and Nursing in [place], [place].  [place] informed client of [facility] program services in nursing, social work, and pharmacy.  CSW provided client with [facility] CSW card.  CSW encouraged client to call CSW at [phone] as needed to discuss social work needs of client. CSW and client completed [facility] assessments for client. Client spoke of her spouse and that her spouse has diagnosis of Alzheimer's. She is concerned about his care and she is hoping that spouse of client may be able to eventually receive facility care, as needed, to address needs of client's spouse. CSW and client spoke of client care plan. CSW encouraged  client to participate in all scheduled client physical therapy sessions for client in next 30 days at [facility] facility.  She said she was looking forward to participating in physical therapy sessions at [facility] facility.  CSW thanked client for allowing CSW to visit with client at the hospital on 08/16/16. Client was appreciative of CSW visit with her at the hospital on 08/16/16.   Plan:    Client to participate in all scheduled client physical therapy sessions for client in next 30 days at [facility] facility.  CSW to call client in 3 weeks to assess client needs at that time.[name].Herberth Deharo MSW, LCSW Licensed Clinical Social Worker [facility] Care Management [phone]

## 2016-08-19 DIAGNOSIS — M545 Low back pain: Secondary | ICD-10-CM | POA: Diagnosis not present

## 2016-08-19 DIAGNOSIS — M4306 Spondylolysis, lumbar region: Secondary | ICD-10-CM | POA: Diagnosis not present

## 2016-08-19 DIAGNOSIS — E039 Hypothyroidism, unspecified: Secondary | ICD-10-CM | POA: Diagnosis not present

## 2016-08-19 DIAGNOSIS — S82891D Other fracture of right lower leg, subsequent encounter for closed fracture with routine healing: Secondary | ICD-10-CM | POA: Diagnosis not present

## 2016-08-20 ENCOUNTER — Other Ambulatory Visit: Payer: Self-pay | Admitting: Licensed Clinical Social Worker

## 2016-08-20 NOTE — Patient Outreach (Signed)
Assessment:  CSW traveled  to [place] in [place], [place] on 08/20/16 to visit client. CSW met with client on 08/20/16 at [facility] facility in room of client. Client is adjusting to care at [facility] facility.  She is receving nursing care and physical therapy services at facility. CSW and client spoke of client care plan. CSW encouraged client to participate in all scheduled physical therapy sessions for client in next 30 days at nursing facility. Client said she is sleeping adequately. She said she is eating adequately.  Client said she is cooperating with care providers at facility.  Client spoke of her spouse's needs. Her spouse is seeking placement for spouse of client  at [facility] in [place], [place].  Client is taking medications as prescribed.  Client said she is taking a prescirbed medication for pain.  Client uses glasses for vision assistance.  She said she has support from her son who resides in [place], [place]. [place] informed client that client could speak with Gerrit Friends, facility social worker, to discuss discharge plan for client. Client understood this information regarding [facility] Friends, facility social worker. CSW thanked client for allowing CSW to visit client at nursing facility on 08/20/16. Client was appreciative of CSW visit with client at nursing center on 08/20/16.   Plan:  Client to participate in all scheduled client physical therapy  sessions for client in next 30 days at nursing facility.   CSW to call client in 2 weeks to assess client needs at that time.   [name].Inanna Telford MSW, LCSW Licensed Clinical Social Worker [facility] Care Management [phone]

## 2016-08-25 DIAGNOSIS — E039 Hypothyroidism, unspecified: Secondary | ICD-10-CM | POA: Diagnosis not present

## 2016-08-25 DIAGNOSIS — S82891D Other fracture of right lower leg, subsequent encounter for closed fracture with routine healing: Secondary | ICD-10-CM | POA: Diagnosis not present

## 2016-08-25 DIAGNOSIS — F329 Major depressive disorder, single episode, unspecified: Secondary | ICD-10-CM | POA: Diagnosis not present

## 2016-08-25 DIAGNOSIS — E119 Type 2 diabetes mellitus without complications: Secondary | ICD-10-CM | POA: Diagnosis not present

## 2016-08-26 DIAGNOSIS — S82891D Other fracture of right lower leg, subsequent encounter for closed fracture with routine healing: Secondary | ICD-10-CM | POA: Diagnosis not present

## 2016-08-26 DIAGNOSIS — M4306 Spondylolysis, lumbar region: Secondary | ICD-10-CM | POA: Diagnosis not present

## 2016-08-26 DIAGNOSIS — E039 Hypothyroidism, unspecified: Secondary | ICD-10-CM | POA: Diagnosis not present

## 2016-08-26 DIAGNOSIS — E119 Type 2 diabetes mellitus without complications: Secondary | ICD-10-CM | POA: Diagnosis not present

## 2016-09-03 ENCOUNTER — Other Ambulatory Visit: Payer: Self-pay | Admitting: Licensed Clinical Social Worker

## 2016-09-03 DIAGNOSIS — S82831A Other fracture of upper and lower end of right fibula, initial encounter for closed fracture: Secondary | ICD-10-CM | POA: Diagnosis not present

## 2016-09-03 DIAGNOSIS — S82832A Other fracture of upper and lower end of left fibula, initial encounter for closed fracture: Secondary | ICD-10-CM | POA: Diagnosis not present

## 2016-09-03 NOTE — Patient Outreach (Signed)
Assessment:  CSW spoke via phone with client. CSW verified client identity. CSW received verbal permission from client on 09/03/16 for CSW to communicate with client about current client needs and status. Client is receiving care at [facility] and Nursing in [place], [place]. Client is receiving nursing care and physical therapy support at facility. Client has family support. Client had experienced a fall at home with fractures to both of her ankles.  She has informed CSW that she will have to allow time for her ankles to heal from fractures.  She said that at present she is not able to put any weight on her ankles.  CSW and client spoke of client care plan. CSW encouraged client to participate in all scheduled client physical therapy sessions for client in next 30 days at nursing facility. Client said she is sleeping adequately. Client said she is eating adequately. Client is taking a prescribed pain medication. Client said that her spouse has been diagnosed with Alzheimer's disease. She said that her family is seeking nursing home placement for her spouse at present.  Client said she attended medical appointment with orthopedic doctor this morning. She said that orthopedic doctor said that she needed to be non weight bearing on her feet for 4 more weeks. She said her ankle fractures were healing slowly; but, orthopedic doctor said she needed to be non weight bearing on her feet for 4 weeks yet. She said she has been receiving physical therapy sessions as scheduled at nursing home. She said that her spouse is now residing with their son at home of their son in [place], [place]. She said that her son is also looking into applying for Medicaid assistance for long term care for spouse of client.  Client and CSW spoke of client's adjusting to being at nursing center for client care. CSW thanked client for phone call with CSW on 09/03/16. CSW encouraged client to call CSW at [phone] as needed to discuss  social work needs of client.  CSW also spoke with Enid Derry about Abigail's communicating with Ledell Peoples, facility social worker, to develop client discharge plan.   Plan:  Client to participate in all scheduled client physical therapy sessions for client in next 30 days at nursing facility.  CSW to call client in 3 weeks to assess client needs at that time.  [name].Holly Iannaccone MSW, LCSW Licensed Clinical Social Worker [facility] Care Management [phone]

## 2016-09-05 ENCOUNTER — Ambulatory Visit: Payer: PPO | Admitting: Licensed Clinical Social Worker

## 2016-09-20 DIAGNOSIS — F329 Major depressive disorder, single episode, unspecified: Secondary | ICD-10-CM | POA: Diagnosis not present

## 2016-09-20 DIAGNOSIS — F411 Generalized anxiety disorder: Secondary | ICD-10-CM | POA: Diagnosis not present

## 2016-09-26 ENCOUNTER — Other Ambulatory Visit: Payer: Self-pay | Admitting: Licensed Clinical Social Worker

## 2016-09-26 NOTE — Patient Outreach (Signed)
Assessment:  CSW spoke via phone with client. CSW verified client identity. CSW and client spoke of client needs. Client sees Dr. Nevada Crane as primary care doctor. Client said she had her prescribed medications and is taking medications as prescribed. Client had previously experienced a fall at her home with fractures to both of her ankles. She has been receiving care at [facility] and Nursing in [place], [place]. She has been receiving nursing care and physical therapy support at nursing facility. Client is eating adequately and sleeping adequately. Client is taking a prescribed pain medication. CSW and client spoke of client care plan. CSW encouraged client to participate in all scheduled client physical therapy sessions for client in next 30 days at nursing facility. Client is attending medical appointments, as scheduled, with orthopedic doctor. Vanessa Atkins said that she has appointment scheduled with orthopedic doctor for next Tuesday.   Client said that her spouse has been diagnosed with Alzheimer's. She said her son is seeking nursing home placement for spouse of client. Client said she had made several appeals regarding her insurance coverage for her costs at nursing center. She said that her last appeal was denied and thus she may be needing to plan for client discharge back to home. CSW called [facility], facility social worker, on 09/26/16 and spoke with Vanessa Atkins about client's discharge plans. Vanessa Atkins informed CSW that she Vanessa Atkins) would meet with client today and talk more wth client today about client discharge plans.  Client said she did not have a wheelchair at home at present. CSW shared this information regarding wheelchair with [facility] Friends on 09/26/16. Vanessa Atkins said client has [employer] and thus may have to pay a copay for home health visits for client.  Again, Vanessa Atkins said she would talk today with client about finalizing discharge plans for client.  CSW informed client today that  Vanessa Atkins would be talking today with client about client discharge plans. Client said she was aware of fact that she might have to pay a copay for home health visits for client through [facility]. CSW thanked client for phone call with CSW on 09/26/16. CSW encouraged Vanessa Atkins to call CSW at [phone] as needed to discuss social work needs of client.    Plan:  Client to participate in all scheduled client physical therapy sessions for client in next 30 days at nursing facility.  CSW to call client in 1 week to assess client needs at that time.  Vanessa Atkins MSW, LCSW Licensed Clinical Social Worker [facility] Care Management [phone]

## 2016-09-30 DIAGNOSIS — S82831A Other fracture of upper and lower end of right fibula, initial encounter for closed fracture: Secondary | ICD-10-CM | POA: Diagnosis not present

## 2016-09-30 DIAGNOSIS — S82832A Other fracture of upper and lower end of left fibula, initial encounter for closed fracture: Secondary | ICD-10-CM | POA: Diagnosis not present

## 2016-10-02 ENCOUNTER — Other Ambulatory Visit: Payer: Self-pay | Admitting: Licensed Clinical Social Worker

## 2016-10-02 NOTE — Patient Outreach (Signed)
Assessment:  CSW spoke via phone with client. CSW verified client identity. CSW and client spoke of client needs. Client has been receiving care at [facility] in [place], [place]. She has been receiving nursing care and physical therapy support at this facility. She has some family support from her son who resides in [place], [place].  Client has been communicating as needed with facility social worker, Gerrit Friends, to finalize client discharge plan. Client said she is eating adequately. She said she is sleeping adequately. She said she takes a prescribed pain medication. Client is attending appointments as scheduled with orthopedic doctor. Client said she had appointment with orthopedic doctor yesterday.   Client was given full weight bearing status but has to wear boots when walking and has to use walker when walking. .  Client said her spouse is receiving care currently at [facility] in [place], [place]. She said she thinks her spouse will be at that facility for short term care. Raeven said she is planning to discharge from [facility] in [place], [place] on 10/04/16. After discharge from [facility] in [place], [place] on 10/04/16, she will go to home of her son, Starr Urias, in [place], [place] to reside. She said her son has an apartment set up for her and her spouse at his home in [place], [place]. She said she has been communicating with Gerrit Friends, facility social worker, regarding discharge plans for client. She said she was scheduled to receive a wheelchair to assist her with ambulation upon discharge from nursing center.  She said wheelchair is to be delivered to her at nursing center prior to her discharge from nursing center. She said she was looking forward to discharge from nursing center and going to reside with her son, Pilar Plate. CSW encouraged client to continue to participate in scheduled client physical therapy sessions for client while she was a resident at nursing center.  CSW thanked  client for phone call with CSW on 10/02/16. CSW encouraged client to call CSW at [phone] as needed to discuss social work needs of client. Client was appreciative of phone call from [place] on 10/02/16. ntage insurance.     Plan:  Client to participate in scheduled client physical therapy sessions for client while client resides at nursing center.  CSW to call client or son of client in two weeks to assess client needs at that time.  [name].Roselia Snipe MSW, LCSW Licensed Clinical Social Worker [facility] Care Management [phone]

## 2016-10-08 DIAGNOSIS — S82492D Other fracture of shaft of left fibula, subsequent encounter for closed fracture with routine healing: Secondary | ICD-10-CM | POA: Diagnosis not present

## 2016-10-08 DIAGNOSIS — S82491D Other fracture of shaft of right fibula, subsequent encounter for closed fracture with routine healing: Secondary | ICD-10-CM | POA: Diagnosis not present

## 2016-10-08 DIAGNOSIS — L259 Unspecified contact dermatitis, unspecified cause: Secondary | ICD-10-CM | POA: Diagnosis not present

## 2016-10-08 DIAGNOSIS — E119 Type 2 diabetes mellitus without complications: Secondary | ICD-10-CM | POA: Diagnosis not present

## 2016-10-16 ENCOUNTER — Other Ambulatory Visit: Payer: Self-pay | Admitting: Licensed Clinical Social Worker

## 2016-10-16 NOTE — Patient Outreach (Signed)
Assessment:  CSW spoke via phone with client. CSW verified client identity. CSW and client spoke of client needs. Client sees Dr. Nevada Crane as primary care doctor. Client has been receiving care at [facility] in [place], [place]. She has been receiving nursing care and physical therapy support at facility. She has support also from her son who resides in [place], [place]. Client reported previously to [place] that she takes a prescribed pain medication. Client has been attending appointments as scheduled with orthopedic doctor. Client uses a walker to assist her with ambulation. Client reported to [facility] on 10/16/16 that she received a 14 day extention on her physical therapy services at nursing center. She said she is receiving physical therapy sessions regularly at facility and that these physical therapy sessions are helpful to her. CSW encouraged client to continue to participate in scheduled client physical therapy sessions for client at facility. Client said she planned to discharge from nursing center on this Sunday, September 30,2018. Upon discharge from nursing center on 10/20/16, client plans to travel to [place], [place] to reside with her son at home of her son, Nica Friske. She said that her spouse already is at home of Alexianna Nachreiner. She is excited about her upcoming discharge from the nursing center. She has been communicating with Gerrit Friends, facility social worker, to finalize client discharge plans. CSW thanked client for phone call with CSW on 10/16/16.  CSW encouraged Takisha to call CSW as needed at [phone] to discuss social work needs of client.  Brittan was appreciative of phone call from [place] on 10/16/16.   Plan:  Client to participate in all  scheduled client physical therapy sessions for client at nursing center.    CSW to call client in 3 weeks to assess client needs at that time.  [name].Krina Mraz MSW, LCSW Licensed Clinical Social Worker [facility] Care  Management [phone]

## 2016-10-18 DIAGNOSIS — E039 Hypothyroidism, unspecified: Secondary | ICD-10-CM | POA: Diagnosis not present

## 2016-10-18 DIAGNOSIS — E119 Type 2 diabetes mellitus without complications: Secondary | ICD-10-CM | POA: Diagnosis not present

## 2016-10-18 DIAGNOSIS — M545 Low back pain: Secondary | ICD-10-CM | POA: Diagnosis not present

## 2016-10-18 DIAGNOSIS — S82891D Other fracture of right lower leg, subsequent encounter for closed fracture with routine healing: Secondary | ICD-10-CM | POA: Diagnosis not present

## 2016-10-22 ENCOUNTER — Other Ambulatory Visit: Payer: Self-pay | Admitting: Licensed Clinical Social Worker

## 2016-10-22 DIAGNOSIS — E1110 Type 2 diabetes mellitus with ketoacidosis without coma: Secondary | ICD-10-CM

## 2016-10-23 ENCOUNTER — Other Ambulatory Visit: Payer: Self-pay | Admitting: *Deleted

## 2016-10-23 ENCOUNTER — Encounter: Payer: Self-pay | Admitting: *Deleted

## 2016-10-23 DIAGNOSIS — F411 Generalized anxiety disorder: Secondary | ICD-10-CM | POA: Diagnosis not present

## 2016-10-23 DIAGNOSIS — Z981 Arthrodesis status: Secondary | ICD-10-CM | POA: Diagnosis not present

## 2016-10-23 DIAGNOSIS — S8265XD Nondisplaced fracture of lateral malleolus of left fibula, subsequent encounter for closed fracture with routine healing: Secondary | ICD-10-CM | POA: Diagnosis not present

## 2016-10-23 DIAGNOSIS — M419 Scoliosis, unspecified: Secondary | ICD-10-CM | POA: Diagnosis not present

## 2016-10-23 DIAGNOSIS — N3946 Mixed incontinence: Secondary | ICD-10-CM | POA: Diagnosis not present

## 2016-10-23 DIAGNOSIS — W19XXXD Unspecified fall, subsequent encounter: Secondary | ICD-10-CM | POA: Diagnosis not present

## 2016-10-23 DIAGNOSIS — I4891 Unspecified atrial fibrillation: Secondary | ICD-10-CM | POA: Diagnosis not present

## 2016-10-23 DIAGNOSIS — E1140 Type 2 diabetes mellitus with diabetic neuropathy, unspecified: Secondary | ICD-10-CM | POA: Diagnosis not present

## 2016-10-23 DIAGNOSIS — S8264XD Nondisplaced fracture of lateral malleolus of right fibula, subsequent encounter for closed fracture with routine healing: Secondary | ICD-10-CM | POA: Diagnosis not present

## 2016-10-23 DIAGNOSIS — Z7984 Long term (current) use of oral hypoglycemic drugs: Secondary | ICD-10-CM | POA: Diagnosis not present

## 2016-10-23 DIAGNOSIS — M47816 Spondylosis without myelopathy or radiculopathy, lumbar region: Secondary | ICD-10-CM | POA: Diagnosis not present

## 2016-10-23 DIAGNOSIS — Z9010 Acquired absence of unspecified breast and nipple: Secondary | ICD-10-CM | POA: Diagnosis not present

## 2016-10-23 DIAGNOSIS — F332 Major depressive disorder, recurrent severe without psychotic features: Secondary | ICD-10-CM | POA: Diagnosis not present

## 2016-10-23 DIAGNOSIS — Z853 Personal history of malignant neoplasm of breast: Secondary | ICD-10-CM | POA: Diagnosis not present

## 2016-10-23 NOTE — Patient Outreach (Addendum)
Referral received from [facility] for transition of care calls, pt discharged from [facility] in [place] on 10/20/16 and is living with her son in [place] which is outside service area for home visits.  Telephone call to pt for transition of care week 1, spoke with pt, HIPAA verified, pt reports she is doing well living with her son, pt states she has all medications and taking as prescribed, RN CM reviewed medications with pt, Pt reports her son assists with transportation and pt hopes to be driving soon by end of this month.  Pt has [facility] home health services, pt uses tramadol for back pain and states " the medicine does help"   RN CM faxed barrier letter and transition of care note to primary MD Dr. Nevada Crane. Addendum- pt reported that CBG readings 90-100's and " under good control"  Checks CBG in morning on occasion as instructed by MD.  [facility] CM Care Plan Problem One     Most Recent Value  Care Plan Problem One  Pt high risk for falls due to decreased endurance  Role Documenting the Problem One  Care Management Coordinator  Care Plan for Problem One  Active  THN Long Term Goal   Pt will have increased endurance and no falls within 60 days  THN Long Term Goal Start Date  10/23/16  Interventions for Problem One Long Term Goal  RN CM reviewed medications with pt, reviewed upcoming appointments, reviewed importance of working with home health PT and OT  THN CM Short Term Goal #1   Pt will verbalize safety precautions and adherence within 30 days.  THN CM Short Term Goal #1 Start Date  10/23/16  Interventions for Short Term Goal #1  RN CM reviewed safety precautions with pt, pt states she has wheelchair and walker and is using as prescribed      PLAN Continue weekly transition of care calls  Jacqlyn Larsen [facility], [place] Coordinator [phone]

## 2016-10-30 ENCOUNTER — Other Ambulatory Visit: Payer: Self-pay | Admitting: *Deleted

## 2016-10-30 NOTE — Patient Outreach (Signed)
Telephone call to pt for transition of care week 2, spoke with pt and explained reason for call, pt states "I can't talk to you right now, I'll talk to you later".  Pt hung up the phone.  PLAN Continue weekly transition of care calls  Jacqlyn Larsen [facility], [place] Coordinator [phone]

## 2016-11-06 ENCOUNTER — Other Ambulatory Visit: Payer: Self-pay | Admitting: *Deleted

## 2016-11-06 ENCOUNTER — Other Ambulatory Visit: Payer: Self-pay | Admitting: Licensed Clinical Social Worker

## 2016-11-06 NOTE — Patient Outreach (Signed)
Assessment:  CSW spoke via phone with client. CSW verified client identity. CSW and client spoke of client needs. Client has been staying recently with her son in [place], [place].  Client said she had her prescribed medications and is taking medications as prescribed. Client said that her son had been transporting client to and from client's medical appointments. RN Jacqlyn Larsen is making Transition of Care calls to client. Client has been receiving home health services, as ordered, with [facility].  Client said she takes a prescribed pain medication.  Client said she had to stop services with [facility] since she has decided to move ot [place], [place].  She said she is now residing at [facility] in [place], [place].  She said she is looking for a local apartment in [place], [place]. She said physical therapy was helpful to client. She said her address is [address], in [place], [place].  She said she will look for local apartment today. She said her belonging are packed up at son's home in [place], [place].  She spoke of weakness in her legs.  She said she is eating adequately.  She said she is sleeping adequately.  She said her spouse is currently in the hospital at [facility] in [place], [place].  Client said she has relatives also in [place], [place] area that may be able to help her periodically. She said she is scheduled to have appointment with Dr. Nevada Crane later this month. She said she can walk short distances with a walker. She can drive now, according to client and has been driving herself to local appointments or to complete errands.  CSW and client spoke of client care plan. CSW encouraged client to communicate with CSW in next 30 days to discuss community resources of assistance for client. Client said she hopes to reside in [place], [place]. She said she has relatives and friends who live in [place], [place]. [place] thanked client for phone call with CSW on 11/06/16. CSW encouraged [place] to call CSW at [phone]  as needed to discuss social work needs of client.  Client was appreciative of phone call from [place] on 11/06/16.   Plan:  Client to communicate with CSW in next 30 days to discuss community resources of assistance for client.  CSW to collaborate with RN Jacqlyn Larsen, as needed, to monitor needs of client.  CSW to call client in 3 weeks to assess client needs at that time.  [name].Salome Hautala MSW, LCSW Licensed Clinical Social Worker [facility] Care Management [phone]

## 2016-11-06 NOTE — Patient Outreach (Signed)
Telephone call to pt for transition of care week 3, spoke with pt, HIPAA verified, pt reports her husband is in the hospital and pt is living in [place] in [place] temporarily because " things didn't work out at my son's house" Pt states she called 5-6 places today and looking for new residence and hopefully can move within 1-2 weeks, pt reports she is using walker and will be interested in PT when she gets in permanent residence but does not want to pursue this option at present.  Pt states " I haven't been checking my blood sugar, too much going on"  Pt does not want home visit at present and prefers to wait until she moves into permanent residence, pt states the house she used to live in [place], [place] has been "put up for sale, it's my son's house".  THN CM Care Plan Problem One     Most Recent Value  Care Plan Problem One  Pt high risk for falls due to decreased endurance  Role Documenting the Problem One  Care Management [place] for Problem One  Active  THN Long Term Goal   Pt will have increased endurance and no falls within 60 days  THN Long Term Goal Start Date  10/23/16  Interventions for Problem One Long Term Goal  Pt reports she has all medications, RN CM reviewed importance of taking as prescribed  THN CM Short Term Goal #1   Pt will verbalize safety precautions and adherence within 30 days.  THN CM Short Term Goal #1 Start Date  10/23/16  Interventions for Short Term Goal #1  RN CM reinforced safety precautions with pt, pt states she has wheelchair and walker and is using as prescribed, pt no longer has home health PT due to moved into hotel temporarily, pt not interested in another order for PT until she moves into permanent residence which hopefully will be within next 1-2 weeks.      PLAN Continue weekly transition of care calls  Jacqlyn Larsen [facility], [place] Coordinator [phone]

## 2016-11-13 ENCOUNTER — Other Ambulatory Visit: Payer: Self-pay | Admitting: *Deleted

## 2016-11-13 NOTE — Patient Outreach (Signed)
Telephone call to pt for transition of care week 4, no answer to telephone and no option to leave voicemail.  PLAN Outreach pt beginning of next week to schedule home visit  Jacqlyn Larsen [facility], [place] Coordinator [phone]

## 2016-11-15 DIAGNOSIS — E039 Hypothyroidism, unspecified: Secondary | ICD-10-CM | POA: Diagnosis not present

## 2016-11-15 DIAGNOSIS — I10 Essential (primary) hypertension: Secondary | ICD-10-CM | POA: Diagnosis not present

## 2016-11-15 DIAGNOSIS — E1165 Type 2 diabetes mellitus with hyperglycemia: Secondary | ICD-10-CM | POA: Diagnosis not present

## 2016-11-18 ENCOUNTER — Other Ambulatory Visit: Payer: Self-pay | Admitting: *Deleted

## 2016-11-18 NOTE — Patient Outreach (Signed)
Telephone call to pt to schedule initial home visit, no answer to telephone, left voicemail requesting return phone call.  Pt called back and left voicemail stating she got moved into new residence but "everything is in disarray, trying to get heat turned on and I will call you back later"  PLAN Attempt to reach pt within 7 days  Jacqlyn Larsen [facility], [place] Coordinator [phone]

## 2016-11-19 DIAGNOSIS — M6281 Muscle weakness (generalized): Secondary | ICD-10-CM | POA: Diagnosis not present

## 2016-11-20 DIAGNOSIS — Z23 Encounter for immunization: Secondary | ICD-10-CM | POA: Diagnosis not present

## 2016-11-20 DIAGNOSIS — R296 Repeated falls: Secondary | ICD-10-CM | POA: Diagnosis not present

## 2016-11-20 DIAGNOSIS — E119 Type 2 diabetes mellitus without complications: Secondary | ICD-10-CM | POA: Diagnosis not present

## 2016-11-20 DIAGNOSIS — E039 Hypothyroidism, unspecified: Secondary | ICD-10-CM | POA: Diagnosis not present

## 2016-11-25 ENCOUNTER — Other Ambulatory Visit: Payer: Self-pay | Admitting: Licensed Clinical Social Worker

## 2016-11-25 ENCOUNTER — Other Ambulatory Visit: Payer: Self-pay | Admitting: *Deleted

## 2016-11-25 NOTE — Patient Outreach (Signed)
Assessment:  CSW spoke via phone with Vanessa Atkins. CSW verified identity of Vanessa Atkins. CSW and [place] spoke of needs of Vanessa Atkins.  Vanessa Atkins and her spouse had been residing recently at [facility] in [place], [place] and had been looking for local apartment in the area. She said she has relatives and friends in the [place], [place] area and hopes to reside in [place], [place].  She said she and her spouse recently located a small home to rent in [place], [place]. She said she and her spouse now reside at [address] [place], [place]. She has the same phone number she previously used. She said also that she has her prescribed medications and is taking medications as prescribed. She sees Dr. Nevada Atkins as her primary care doctor. She said she is attending medical appointments as scheduled. She said she fatigues occasionally and has to take needed rest breaks. She can walk short distances with use of a walker. She can drive herself to appointments or to complete errands needed. CSW and client spoke of client care plan. CSW encouraged client to communicate with CSW in next 30 days to discuss community resources of assistance for client. Client said that her spouse sometimes wanders and it is difficult for her to keep up with him since she can only walk with use of a walker.She has some family support. She said she is eating adequately.  Vanessa Atkins said that she has to deal daily with pain issues and said she is taking a pain medication as prescribed.  She said she and her spouse have their belongings in their rental home and she slowly unpacks belongings as she is able.  She is trying to attend all scheduled client medical appointments. She spoke of [facility] nursing support with RN Vanessa Atkins.   She is appreciative of [facility] program support. She said she no longer has to wear the orthopedics boots she previously was wearing for support.  CSW encouraged Vanessa Atkins to call CSW at [phone] as needed to discuss social work needs of client. CSW  thanked [place] for phone call with CSW on 11/25/16.   Plan:  Client to communicate with CSW in next 30 days to discuss community resources of assistance for client.     CSW to collaborate with RN Vanessa Atkins, as needed, in monitoring needs of client.  CSW to call client in 4 weeks to assess client needs at that time.  Vanessa Atkins.Hasana Alcorta MSW, LCSW Licensed Clinical Social Worker [facility] Care Management [phone]

## 2016-11-25 NOTE — Patient Outreach (Signed)
Telephone call to pt to schedule home visit, pt states " we just got to [place], I can't talk right now, call back later".    Rn CM called back later in the day and no answer to telephone, left voicemail requesting return phone call.  Jacqlyn Larsen [facility], BSN [place] Coordinator [phone] '

## 2016-12-02 ENCOUNTER — Other Ambulatory Visit: Payer: Self-pay | Admitting: *Deleted

## 2016-12-02 NOTE — Patient Outreach (Signed)
Telephone call to pt to schedule initial home visit, spoke with pt, HIPAA verified, home visit scheduled for this week.  PLAN See pt for home visit this week  Jacqlyn Larsen [facility], [place] Coordinator [phone]

## 2016-12-05 ENCOUNTER — Encounter: Payer: Self-pay | Admitting: *Deleted

## 2016-12-05 ENCOUNTER — Other Ambulatory Visit: Payer: Self-pay | Admitting: *Deleted

## 2016-12-05 NOTE — Patient Outreach (Addendum)
[facility]) Care Management   12/05/2016  Vanessa Atkins 1934-09-24 [id number]  Vanessa Atkins is an 82 y.o. female  Subjective: Initial home visit with pt, HIPAA verified, patient's husband present, pt is primary caregiver for husband who has alzheimers.  Pt states "my biggest problem is I can't get around very well, I never fully got over my ankle fractures"  Pt states she still drives and not sure she wants to participate in outpatient physical therapy.  Pt checks CBG once weekly with readings 90-100 and "Hgb AIC 6.7" per pt and states "that's not anything I wanted to work on, I'm pleased with it"  Objective:   Vitals:   12/05/16 1210  BP: 120/64  Pulse: 80  Resp: 18  SpO2: 95%  Weight: 200 lb (90.7 kg)  Height: 1.727 m (5\' 8" )   ROS  Physical Exam  Constitutional: She is oriented to person, place, and time. She appears well-developed and well-nourished.  HENT:  Head: Normocephalic.  Neck: Normal range of motion. Neck supple.  Cardiovascular: Normal rate and regular rhythm.  Respiratory: Effort normal and breath sounds normal.  GI: Soft. Bowel sounds are normal.  Musculoskeletal: Normal range of motion. She exhibits edema.  1+ edema lower extremities mostly at ankle area  Neurological: She is alert and oriented to person, place, and time.  Skin: Skin is warm and dry.  Psychiatric: She has a normal mood and affect. Her behavior is normal. Judgment and thought content normal.    Encounter Medications:   Outpatient Encounter Medications as of 12/05/2016  Medication Sig Note  . alprazolam (XANAX) 2 MG tablet Take 2 mg by mouth at bedtime as needed for sleep.   . citalopram (CELEXA) 20 MG tablet Take 20 mg daily by mouth. Take at bedtime   . gabapentin (NEURONTIN) 300 MG capsule Take 300-600 mg by mouth 3 (three) times daily. 06/27/2014: Taking as needed during day but takes 600mg  at night every night.     Marland Kitchen glipiZIDE (GLUCOTROL) 5 MG tablet Take 10 mg  by mouth daily before breakfast.    . hydrocortisone valerate ointment (WEST-CORT) 0.2 % Apply 1 application topically 2 (two) times daily as needed (skin rash). 06/27/2014: Uses for bug bites as needed; currently not using.   Marland Kitchen levothyroxine (SYNTHROID, LEVOTHROID) 50 MCG tablet Take 75 mcg by mouth daily before breakfast.    . Multiple Vitamins-Minerals (MULTIVITAMIN WITH MINERALS) tablet Take 1 tablet by mouth daily.   . traMADol (ULTRAM) 50 MG tablet Take every 6 (six) hours as needed by mouth.   . docusate sodium (COLACE) 100 MG capsule Take 1 capsule (100 mg total) by mouth 3 (three) times daily as needed for mild constipation. (Patient not taking: Reported on 12/05/2016)   . HYDROcodone-acetaminophen (NORCO) 10-325 MG per tablet Take 1 tablet by mouth every 6 (six) hours as needed. (Patient not taking: Reported on 10/23/2016)   . lisinopril (PRINIVIL,ZESTRIL) 10 MG tablet Take 10 mg by mouth daily.   . methocarbamol (ROBAXIN) 500 MG tablet Take 1 tablet (500 mg total) by mouth 3 (three) times daily as needed for muscle spasms. (Patient not taking: Reported on 06/27/2014)   . ondansetron (ZOFRAN) 4 MG tablet Take 1 tablet (4 mg total) by mouth every 8 (eight) hours as needed for nausea or vomiting. (Patient not taking: Reported on 06/27/2014)   . oxybutynin (DITROPAN) 5 MG tablet Take 5 mg by mouth daily.   . tamoxifen (NOLVADEX) 20 MG tablet Take 20 mg by  mouth daily.    No facility-administered encounter medications on file as of 12/05/2016.     Functional Status:   In your present state of health, do you have any difficulty performing the following activities: 12/05/2016 08/16/2016  Hearing? N N  Vision? N Y  Comment - uses reading glasses  Difficulty concentrating or making decisions? N N  Walking or climbing stairs? Y Y  Dressing or bathing? Y Y  Doing errands, shopping? [place]  Preparing Food and eating ? Y Y  Using the Toilet? N Y  In the past six months, have you accidently leaked  urine? Y N  Do you have problems with loss of bowel control? N N  Managing your Medications? N Y  Managing your Finances? N Y  Housekeeping or managing your Housekeeping? Y Y  Some recent data might be hidden    Fall/Depression Screening:    Fall Risk  12/05/2016 08/16/2016 09/06/2014  Falls in the past year? Yes Yes No  Number falls in past yr: 2 or more 2 or more -  Injury with Fall? Yes Yes -  Risk Factor Category  High Fall Risk High Fall Risk -  Risk for fall due to : History of fall(s);Impaired balance/gait;Impaired mobility;Medication side effect History of fall(s);Impaired balance/gait;Impaired mobility Impaired balance/gait  Follow up Falls evaluation completed;Education provided Falls prevention discussed -   PHQ 2/9 Scores 12/05/2016 08/16/2016 08/05/2014 06/27/2014  PHQ - 2 Score 2 2 0 0  PHQ- 9 Score 4 7 - -    Assessment:  RN CM reviewed medications with pt, walked through house with pt for safety check, pt is using walker and ambulates slowly,  Pt plans to speak with primary MD about outpatient physical therapy.   RN CM faxed initial home visit and barrier letter to primary MD.  [facility] CM Care Plan Problem One     Most Recent Value  Care Plan Problem One  Pt high risk for falls due to decreased endurance  Role Documenting the Problem One  Care Management [place] for Problem One  Active  THN Long Term Goal   Pt will have increased endurance and no falls within 60 days  THN Long Term Goal Start Date  10/23/16  Interventions for Problem One Long Term Goal  RN CM reviewed all medications with pt, reviewed side effects which can increase risk for falls  THN CM Short Term Goal #1   Pt will verbalize safety precautions and adherence within 30 days.  THN CM Short Term Goal #1 Start Date  11/23/16 [goal restarted]  Interventions for Short Term Goal #1  RN CM reviewed safety precautions, ask pt to continue using walker, patient's pathways are clear  THN CM Short Term  Goal #2   Pt will discuss outpatient rehab with Dr. Nevada Crane within 30 days  [facility] CM Short Term Goal #2 Start Date  12/05/16  Interventions for Short Term Goal #2  RN CM talked with pt about outpatient rehab (pt is not candidate for home PT because she drives and leaves home), RN CM offered to contact MD and get order for outpatient rehab and pt declines stating she wants to talk it over with MD firist.      Plan: see pt for home visit next month Collaborate with CSW as needed  Jacqlyn Larsen [facility], [place] [phone]

## 2016-12-20 DIAGNOSIS — M6281 Muscle weakness (generalized): Secondary | ICD-10-CM | POA: Diagnosis not present

## 2016-12-26 ENCOUNTER — Encounter: Payer: Self-pay | Admitting: *Deleted

## 2016-12-26 ENCOUNTER — Other Ambulatory Visit: Payer: Self-pay | Admitting: Licensed Clinical Social Worker

## 2016-12-26 ENCOUNTER — Other Ambulatory Visit: Payer: Self-pay | Admitting: *Deleted

## 2016-12-26 NOTE — Patient Outreach (Signed)
Assessment:  CSW spoke via phone with client. CSW verified client identity. CSW and client spoke of client needs. Client sees Dr. Nevada Crane as primary care doctor. Sherran said that she and her spouse recently located a small home to rent in [place], [place].   She said she and her spouse now reside at [address], [place], [place].  She said she has her prescribed medications and is taking medications as prescribed. She said she is attending medical appointments as scheduled. She said she fatigues occasionally and has to take needed rest breaks.  She said she can walk short distances with use of a walker. She can drive herself to appointments or to complete errands needed.  CSW and Samanth spoke of client care plan. CSW encouraged client to communicate with CSW in next 30 days to discuss community resources of assistance for client. Raigan said that she is eating adequately.  Client said she has pain issues and is taking a pain medication as prescribed.  Client spoke of [facility] nursing support client receives with RN Jacqlyn Larsen.Client said she had some swelling in her ankles and in her feet. But, she said her ankles or her feet do not hurt. She said she was not taking a diuretic.  CSW offered to call RN Jacqlyn Larsen on 12/26/16 and inform Almyra Free of these client symptoms in client's feet and ankles. Client agreed to this plan. She said she is eating adequately.  She said she drives with no problem. But she said she sometimes has difficulty getting in and out of car. She said she has to call and make appointment with orthopedic doctor in near future for her final visit with orthopedic doctor.   She said has adequate food supply. However, she said it is hard to stand for very long to try to cook food.  She said she has appointment with Dr. Nevada Crane in January of 2019.  Client said she can get in and out of their residence with no problem.  CSW thanked client for phone call with CSW on 12/26/16.  Client was appreciative of phone call from  [place] on 12/26/16   Plan:  Client to communicate with CSW in next 30 days to discuss community resources of assistance for client.   CSW to collaborate with RN Jacqlyn Larsen in monitoring needs of client.  CSW to call client in 4 weeks to assess client needs at that time.  [name].Okechukwu Regnier MSW, LCSW Licensed Clinical Social Worker [facility] Care Management [phone]

## 2016-12-26 NOTE — Patient Outreach (Signed)
RN CM received message from Rhine stating he had spoken with pt today and pt states her feet/ ankle areas are swollen.  RN CM called pt, spoke with pt, HIPAA verified, pt reports " I do have some swelling related to the ankle fractures and I haven't called the doctor"  RN CM ask if pt can see primary care MD if there is a concern about the edema, pt states " no I really don't need to see him, I'm overdue to see orthopedic surgeon and have xrays, that's who I need to see"  RN CM urged pt to call orthopedic surgeon and make appointment for follow up, pt verbalizes understanding that she will make the appointment and is able to do this.  RN CM talked with [facility] CSW Theadore Nan to inform RN CM spoke with pt.  RN CM sent update to primary MD Dr. Nevada Crane and reported swelling.  PLAN See pt for home visit next week  Jacqlyn Larsen [facility], [place] Coordinator [phone]

## 2017-01-01 ENCOUNTER — Other Ambulatory Visit: Payer: Self-pay | Admitting: *Deleted

## 2017-01-01 ENCOUNTER — Encounter: Payer: Self-pay | Admitting: *Deleted

## 2017-01-01 NOTE — Patient Outreach (Signed)
Pt called and cancelled routine home visit today citing "there's snow still piled up everywhere, you won't be able to get in my driveway"  Telephonic assessment completed, HIPAA verified. Pt reports she is to see orthopedic surgeon 01/06/17, denies any falls, continues to have back pain and plans to make appointment with Dr. Carloyn Manner related to this.  Pt reports she has not been checking CBG and will be getting new battery for glucometer, reports she has all medications and taking as prescribed.  Pt continues attending monthly alzheimer's group support related to her husband's dementia, pt is primary caregiver for husband. Pt wants to continue working towards "getting stronger and being able to walk better and further, being safe"  Medications Reviewed Today    Reviewed by Kassie Mends, RN (Registered Nurse) on 01/01/17 at 1137  Med List Status: <None>  Medication Order Taking? Sig Documenting Provider Last Dose Status Informant  alprazolam (XANAX) 2 MG tablet [id number] Yes Take 2 mg by mouth at bedtime as needed for sleep. [provider] Taking Active Self  citalopram (CELEXA) 20 MG tablet [id number] Yes Take 20 mg daily by mouth. Take at bedtime [provider] Taking Active   docusate sodium (COLACE) 100 MG capsule [id number] No Take 1 capsule (100 mg total) by mouth 3 (three) times daily as needed for mild constipation.  Patient not taking:  Reported on 12/05/2016   Melina Schools, MD Not Taking Active   gabapentin (NEURONTIN) 300 MG capsule [id number] Yes Take 300-600 mg by mouth 3 (three) times daily. [provider] Taking Active Self           Med Note Tonye Royalty, CRYSTAL K   Mon Jun 27, 2014 11:58 AM) Taking as needed during day but takes 600mg  at night every night.     glipiZIDE (GLUCOTROL) 5 MG tablet [id number] Yes Take 10 mg by mouth daily before breakfast.  [provider] Taking Active Self  HYDROcodone-acetaminophen (NORCO) 10-325 MG per tablet [id number]  No Take 1 tablet by mouth every 6 (six) hours as needed.  Patient not taking:  Reported on 10/23/2016   Melina Schools, MD Not Taking Active   hydrocortisone valerate ointment (WEST-CORT) 0.2 % [id number] No Apply 1 application topically 2 (two) times daily as needed (skin rash). [provider] Not Taking Active Self           Med Note Tonye Royalty, CRYSTAL K   Mon Jun 27, 2014  9:38 AM) Uses for bug bites as needed; currently not using.   levothyroxine (SYNTHROID, LEVOTHROID) 50 MCG tablet [id number] Yes Take 75 mcg by mouth daily before breakfast.  [provider] Taking Active Self  lisinopril (PRINIVIL,ZESTRIL) 10 MG tablet [id number] Yes Take 10 mg by mouth daily. [provider] Taking Active Self  methocarbamol (ROBAXIN) 500 MG tablet [id number] Yes Take 1 tablet (500 mg total) by mouth 3 (three) times daily as needed for muscle spasms. Melina Schools, MD Taking Active   Multiple Vitamins-Minerals (MULTIVITAMIN WITH MINERALS) tablet [id number] Yes Take 1 tablet by mouth daily. [provider] Taking Active Self  ondansetron (ZOFRAN) 4 MG tablet [id number] No Take 1 tablet (4 mg total) by mouth every 8 (eight) hours as needed for nausea or vomiting.  Patient not taking:  Reported on 01/01/2017   Melina Schools, MD Not Taking Active   oxybutynin (DITROPAN) 5 MG tablet [id number] Yes Take 5 mg by mouth daily. [provider] Taking Active Self  tamoxifen (NOLVADEX) 20 MG tablet [id number] Yes Take 20  mg by mouth daily. [provider] Taking Active Self  traMADol (ULTRAM) 50 MG tablet [id number] Yes Take every 6 (six) hours as needed by mouth. [provider] Taking Active   Med List Note Deno Lunger 10/22/16 1245): Client has received physical therapy care and nursing care for several months at [facility] in [place], [place]. She discharged from SNF on 10/20/16 and went to [place], [place] to reside with her son and with her spouse at  home of son, Rever Pichette Please refer to [facility] telephonic nurse manager for [facility] calls per protocol. Client is alert, oriented and understands well her medical condition and needs. Thanks Scott Forrest LCSW          [facility] CM Care Plan Problem One     Most Recent Value  Care Plan Problem One  Pt high risk for falls due to decreased endurance  Role Documenting the Problem One  Care Management [place] for Problem One  Active  [facility] Long Term Goal   Pt will have increased endurance and no falls within 60 days  [facility]N Long Term Goal Start Date  01/01/17 Barrie Folk re-established, pt wants to continue towards this goal]  Interventions for Problem One Long Term Goal  Pt states she has had no falls and wants to continue working towards this goal as she continues to have pain and weakness in her feet post fracture and plans to follow up with orthopedic MD Monday 01/06/17  THN CM Short Term Goal #1   Pt will verbalize safety precautions and adherence within 30 days.  THN CM Short Term Goal #1 Start Date  12/23/16 [ongoing-  restarted]  Interventions for Short Term Goal #1  RN CM reinforced safety precautions, ask pt to continue using walker, patient's pathways are clear  THN CM Short Term Goal #2   Pt will discuss outpatient rehab with Dr. Nevada Crane within 30 days  [facility] CM Short Term Goal #2 Start Date  01/04/17 [goal re-established]  Interventions for Short Term Goal #2  Pt has not discussed with MD yet but does still plan to, may discuss with orthopedic MD Monday 01/06/17     PLAN See pt for home visit in 3 weeks  Jacqlyn Larsen [facility], [place] Coordinator [phone]

## 2017-01-06 DIAGNOSIS — S82423D Displaced transverse fracture of shaft of unspecified fibula, subsequent encounter for closed fracture with routine healing: Secondary | ICD-10-CM | POA: Diagnosis not present

## 2017-01-13 DIAGNOSIS — I4891 Unspecified atrial fibrillation: Secondary | ICD-10-CM | POA: Diagnosis not present

## 2017-01-13 DIAGNOSIS — Z79899 Other long term (current) drug therapy: Secondary | ICD-10-CM | POA: Diagnosis not present

## 2017-01-13 DIAGNOSIS — R05 Cough: Secondary | ICD-10-CM | POA: Diagnosis not present

## 2017-01-13 DIAGNOSIS — R0602 Shortness of breath: Secondary | ICD-10-CM | POA: Diagnosis not present

## 2017-01-13 DIAGNOSIS — E119 Type 2 diabetes mellitus without complications: Secondary | ICD-10-CM | POA: Diagnosis not present

## 2017-01-13 DIAGNOSIS — J4541 Moderate persistent asthma with (acute) exacerbation: Secondary | ICD-10-CM | POA: Diagnosis not present

## 2017-01-19 DIAGNOSIS — M6281 Muscle weakness (generalized): Secondary | ICD-10-CM | POA: Diagnosis not present

## 2017-01-22 ENCOUNTER — Other Ambulatory Visit: Payer: Self-pay | Admitting: *Deleted

## 2017-01-22 NOTE — Patient Outreach (Signed)
Patient called RN CM and cancelled today's scheduled home visit, pt states she has appointment today to see Dr. Alphonzo Cruise (orthopedic) and will not be at home.  Pt requests RN CM call next week to reschedule home visit.  Vanessa Atkins [facility], [place] Coordinator [phone]

## 2017-01-24 ENCOUNTER — Other Ambulatory Visit: Payer: Self-pay | Admitting: *Deleted

## 2017-01-24 ENCOUNTER — Other Ambulatory Visit: Payer: Self-pay | Admitting: Licensed Clinical Social Worker

## 2017-01-24 NOTE — Patient Outreach (Signed)
Telephone call to patient to reschedule home visit, spoke with pt, HIPAA verified, home visit rescheduled for week of 02/03/17.  PLAN See pt this month for home visit  Jacqlyn Larsen [facility], BSN [place] Coordinator [phone]

## 2017-01-24 NOTE — Patient Outreach (Signed)
Assessment::   CSW received phone call from RN Jacqlyn Larsen on 01/24/17 regarding client. Almyra Free said that client had challenges in taking care of her spouse, and had questions about level of care issues and Medicaid application for her spouse. CSW spoke via phone with client. CSW verified client identity. CSW and client spoke of client needs.. CSW spoke with Sun about Medicaid application process for her spouse. CSW talked with Enid Derry about applying for Medicaid for her spouse at  [place] in [place], [place].  Benson talked with Jaylee about customary documents for her spouse that would need to be brought to a Medicaid application appointment for long term care for her spouse. She said she had talked with representative at [facility] facility about Medicaid application process for her spouse. She was told that there had been a transfer of funds and that spouse of client may not be able to qualify for [place] for long term care at this time.  CSW encouraged Evamae to call [employer] Social [employer] Medicaid caseworker to talk with caseworker about Medicaid application for her spouse.  She said it is challenging to care for the needs of her spouse. She said she does have some fiinancial challenges at present. She is receiving [facility] nurisng help with RN Jacqlyn Larsen.  CSW discussed level of care needs for spouse of client with Enid Derry.  She has reduced family support. CSW encouraged Ayomide to call CSW at [phone] as needed to discuss needs of Shirell Struthers. CSW thanked [place] for call with CSW on 01/24/17   Plan:  CSW to call Jissell Trafton as scheduled to assess client needs.  Client to communicate with CSW in next 30 days to discuss  community resources of assistance for client.  [name].Kalen Neidert MSW, LCSW Licensed Clinical Social Worker [facility] Care Management [phone]

## 2017-01-28 ENCOUNTER — Other Ambulatory Visit: Payer: Self-pay | Admitting: Licensed Clinical Social Worker

## 2017-01-28 NOTE — Patient Outreach (Signed)
Assessment:  CSW spoke via phone with [place] Medicaid caseworker, Hinton Dyer, on 01/28/17. CSW described to [place] client situation and client questions about Medicaid application for spouse of client. Hinton Dyer reported to [place] that Sharyn Lull had never applied for Medicaid in [place], [place].  Hinton Dyer said she would need to speak with Jeris Penta regarding questions Adaleah Forget had about transfer of assets. CSW spoke via phone with client on 01/28/17.  CSW verified client identity. CSW received verbal permission from client for CSW to speak with client about client needs and status. Client receives [facility] nursing support with RN Jacqlyn Larsen.  Client has financial challenges. She also has reduced energy. She said she sometimes has to take periodic rest breaks. She has challenges in caring for her spouse. Her spouse has Alzheimer's Disease.  CSW talked with Enid Derry last week about level of care needs for her spouse. CSW talked with Ciclaly last week about Medicaid application for her spouse and documents to take to [facility] application.  Fraida said she was trying to manage her daily needs and those of her spouse. She said her spouse had been in several different skilled nursing facilities to receive care. CSW talked with Likisha about Medicaid application process for her spouse and documents to take to a Medicaid application for her spouse. CSW talked with Enid Derry about her care plan. CSW encouraged that Chaquita communicate with CSW in next 30 days to discuss community resources of assistance for client. CSW thanked [place] for phone call with CSW on 01/28/17.   Plan:  Client to communicate with CSW in next 30 days to discuss community resources of assistance for client.  CSW to collaborate with RN Jacqlyn Larsen in monitoring needs of client.  CSW to call client in 4 weeks to assess client needs at that time.  [name].Anice Wilshire MSW, LCSW Licensed Clinical Social  Worker [facility] Care Management [phone]

## 2017-01-29 ENCOUNTER — Ambulatory Visit: Payer: Self-pay | Admitting: *Deleted

## 2017-02-04 ENCOUNTER — Other Ambulatory Visit: Payer: Self-pay | Admitting: *Deleted

## 2017-02-04 ENCOUNTER — Encounter: Payer: Self-pay | Admitting: *Deleted

## 2017-02-04 NOTE — Patient Outreach (Signed)
[facility]) Care Management   02/04/2017  Vanessa Atkins March 06, 1934 [id number]  Vanessa Atkins is an 83 y.o. female  Subjective: Routine home visit with pt, HIPAA verified, pt reports she saw Dr. Alphonzo Cruise for her ankle issues and has now been released citing "doctor said I was doing well and ankle fractures are healed"  Pt states she continues using walker daily, continues going to alzheimer's support group and caring for her husband at home. Pt states she rarely checks CBG and this is not a concern for her citing Hgb AIC " was good" .  No medication changes reported.  Objective:   Vitals:   02/04/17 1215  BP: 118/62  Pulse: 76  Resp: 18  SpO2: 96%   ROS  Physical Exam  Constitutional: She is oriented to person, place, and time. She appears well-developed and well-nourished.  HENT:  Head: Normocephalic.  Neck: Normal range of motion. Neck supple.  Cardiovascular: Normal rate.  Respiratory: Effort normal and breath sounds normal.  GI: Soft. Bowel sounds are normal.  Musculoskeletal: Normal range of motion. She exhibits edema.  1+ edema lower extremities bil  Neurological: She is alert and oriented to person, place, and time.  Skin: Skin is warm and dry.  Psychiatric: She has a normal mood and affect. Her behavior is normal. Judgment and thought content normal.    Encounter Medications:   Outpatient Encounter Medications as of 02/04/2017  Medication Sig Note  . alprazolam (XANAX) 2 MG tablet Take 2 mg by mouth at bedtime as needed for sleep.   . citalopram (CELEXA) 20 MG tablet Take 20 mg daily by mouth. Take at bedtime   . gabapentin (NEURONTIN) 300 MG capsule Take 300-600 mg by mouth 3 (three) times daily. 06/27/2014: Taking as needed during day but takes 687m at night every night.     .Marland KitchenglipiZIDE (GLUCOTROL) 5 MG tablet Take 10 mg by mouth daily before breakfast.    . HYDROcodone-acetaminophen (NORCO) 10-325 MG per tablet Take 1 tablet by mouth every  6 (six) hours as needed.   .Marland Kitchenlevothyroxine (SYNTHROID, LEVOTHROID) 50 MCG tablet Take 75 mcg by mouth daily before breakfast.    . Multiple Vitamins-Minerals (MULTIVITAMIN WITH MINERALS) tablet Take 1 tablet by mouth daily.   . traMADol (ULTRAM) 50 MG tablet Take every 6 (six) hours as needed by mouth.   . docusate sodium (COLACE) 100 MG capsule Take 1 capsule (100 mg total) by mouth 3 (three) times daily as needed for mild constipation. (Patient not taking: Reported on 12/05/2016)   . hydrocortisone valerate ointment (WEST-CORT) 0.2 % Apply 1 application topically 2 (two) times daily as needed (skin rash). 06/27/2014: Uses for bug bites as needed; currently not using.   .Marland Kitchenlisinopril (PRINIVIL,ZESTRIL) 10 MG tablet Take 10 mg by mouth daily.   . methocarbamol (ROBAXIN) 500 MG tablet Take 1 tablet (500 mg total) by mouth 3 (three) times daily as needed for muscle spasms. (Patient not taking: Reported on 02/04/2017)   . ondansetron (ZOFRAN) 4 MG tablet Take 1 tablet (4 mg total) by mouth every 8 (eight) hours as needed for nausea or vomiting. (Patient not taking: Reported on 01/01/2017)   . oxybutynin (DITROPAN) 5 MG tablet Take 5 mg by mouth daily.   . tamoxifen (NOLVADEX) 20 MG tablet Take 20 mg by mouth daily.    No facility-administered encounter medications on file as of 02/04/2017.     Functional Status:   In your present state of health, do  you have any difficulty performing the following activities: 12/05/2016 08/16/2016  Hearing? N N  Vision? N Y  Comment - uses reading glasses  Difficulty concentrating or making decisions? N N  Walking or climbing stairs? Y Y  Dressing or bathing? Y Y  Doing errands, shopping? Tempie Donning  Preparing Food and eating ? Y Y  Using the Toilet? N Y  In the past six months, have you accidently leaked urine? Y N  Do you have problems with loss of bowel control? N N  Managing your Medications? N Y  Managing your Finances? N Y  Housekeeping or managing your  Housekeeping? Y Y  Some recent data might be hidden    Fall/Depression Screening:    Fall Risk  02/04/2017 12/05/2016 08/16/2016  Falls in the past year? Yes Yes Yes  Number falls in past yr: 2 or more 2 or more 2 or more  Injury with Fall? Yes Yes Yes  Risk Factor Category  High Fall Risk High Fall Risk High Fall Risk  Risk for fall due to : History of fall(s);Medication side effect History of fall(s);Impaired balance/gait;Impaired mobility;Medication side effect History of fall(s);Impaired balance/gait;Impaired mobility  Follow up Falls evaluation completed;Education provided Falls evaluation completed;Education provided Falls prevention discussed   PHQ 2/9 Scores 12/05/2016 08/16/2016 08/05/2014 06/27/2014  PHQ - 2 Score 2 2 0 0  PHQ- 9 Score 4 7 - -    Assessment:  RN CM reviewed medications with pt, reviewed discharge plan and will discharge today, pt feels she is stronger but not where she used to be before ankle fractures.  RNCM sent in basket to [place] reporting RN CM discharge today.  TH[facility] CM Care Plan Problem One     Most Recent Value  Care Plan Problem One  Pt high risk for falls due to decreased endurance  Role Documenting the Problem One  Care Management [place] for Problem One  Active  THN Long Term Goal   Pt will have increased endurance and no falls within 60 days  [facility]N Long Term Goal Start Date  01/01/17 [goal re-established, pt wants to continue towards this goal]  [facility] Long Term Goal Met Date  02/04/17  Interventions for Problem One Long Term Goal  Pt released from orthopedic Dr. Alphonzo Cruise citing MD states she is "healed and doing better"  [facility]N CM Short Term Goal #1   Pt will verbalize safety precautions and adherence within 30 days.  [facility] CM Short Term Goal #1 Start Date  12/23/16 [ongoing-  restarted]  [facility] CM Short Term Goal #1 Met Date  02/04/17  Interventions for Short Term Goal #1  RN CM reiterated safety precautions, ask pt to continue using  walker, patient's pathways are clear  THN CM Short Term Goal #2   Pt will discuss outpatient rehab with Dr. Nevada Crane within 30 days  [facility] CM Short Term Goal #2 Start Date  01/04/17 [goal re-established]  [facility]N CM Short Term Goal #2 Met Date  02/04/17  Interventions for Short Term Goal #2  Pt states she has decided not to pursue outpatient PT and will do prescribed exercises at home.      Plan: discharge pt today  Jacqlyn Larsen [facility], BSN [place] Coordinator [phone]

## 2017-02-12 DIAGNOSIS — M25552 Pain in left hip: Secondary | ICD-10-CM | POA: Diagnosis not present

## 2017-02-12 DIAGNOSIS — M5134 Other intervertebral disc degeneration, thoracic region: Secondary | ICD-10-CM | POA: Diagnosis not present

## 2017-02-12 DIAGNOSIS — M545 Low back pain: Secondary | ICD-10-CM | POA: Diagnosis not present

## 2017-02-12 DIAGNOSIS — M546 Pain in thoracic spine: Secondary | ICD-10-CM | POA: Diagnosis not present

## 2017-02-12 DIAGNOSIS — M4127 Other idiopathic scoliosis, lumbosacral region: Secondary | ICD-10-CM | POA: Diagnosis not present

## 2017-02-12 DIAGNOSIS — Z981 Arthrodesis status: Secondary | ICD-10-CM | POA: Diagnosis not present

## 2017-02-12 DIAGNOSIS — M47817 Spondylosis without myelopathy or radiculopathy, lumbosacral region: Secondary | ICD-10-CM | POA: Diagnosis not present

## 2017-02-19 DIAGNOSIS — M6281 Muscle weakness (generalized): Secondary | ICD-10-CM | POA: Diagnosis not present

## 2017-03-03 ENCOUNTER — Other Ambulatory Visit: Payer: Self-pay | Admitting: Licensed Clinical Social Worker

## 2017-03-03 NOTE — Patient Outreach (Signed)
Assessment:  CSW spoke via phone with client on 03/03/17. CSW verified client identity. CSW received verbal permission from client on 03/03/17 for CSW to speak with client about client need and status.Client sees Dr. Nevada Crane as primary care doctor. Client said she had her prescribed medications and was taking medications as prescribed. Client has reduced energy and said she sometimes had to take periodic rest breaks  She has challenges in caring for her spouse.  Her spouse has Alzheimer's disease.  Earlena said she was trying to manage her daily needs and those of her spouse.  CSW has talked with Enid Derry about Medicaid application process for her spouse and about documents to take to a Medicaid application for her spouse.  CSW talked with Enid Derry about her care plan. CSW encouraged that [place] communicate with CSW in next 30 days to discuss community resources of assistance for client.   Client uses walker to assist client in ambulation.  [facility] RN Jacqlyn Larsen recently discharged client from [facility] care.  Client said she will try to go to Alzheimer's Support group meeting this afternoon. Client said that this support group is very helpful to client and that she is glad that she can participate in such a support group. . Client said she has appointment to see Dr. Nevada Crane next week. Client spoke of her use of walker to ambulate.  Client said she was eating adequately. CSW encouraged [place] to call CSW at [phone] as needed to discuss social work needs of client. CSW thanked [place] for phone call with CSW on 03/03/17.   Plan:  Client to communicate with CSW in next 30 days to discuss community resources of assistance for client.  CSW to call client in 4 weeks to assess client needs at that time.  [name].Gregor Dershem MSW, LCSW Licensed Clinical Social Worker [facility] Care Management [phone]

## 2017-03-13 DIAGNOSIS — M4127 Other idiopathic scoliosis, lumbosacral region: Secondary | ICD-10-CM | POA: Diagnosis not present

## 2017-03-13 DIAGNOSIS — M47817 Spondylosis without myelopathy or radiculopathy, lumbosacral region: Secondary | ICD-10-CM | POA: Diagnosis not present

## 2017-03-13 DIAGNOSIS — E039 Hypothyroidism, unspecified: Secondary | ICD-10-CM | POA: Diagnosis not present

## 2017-03-13 DIAGNOSIS — E119 Type 2 diabetes mellitus without complications: Secondary | ICD-10-CM | POA: Diagnosis not present

## 2017-03-18 DIAGNOSIS — F5101 Primary insomnia: Secondary | ICD-10-CM | POA: Diagnosis not present

## 2017-03-18 DIAGNOSIS — M545 Low back pain: Secondary | ICD-10-CM | POA: Diagnosis not present

## 2017-03-18 DIAGNOSIS — F330 Major depressive disorder, recurrent, mild: Secondary | ICD-10-CM | POA: Diagnosis not present

## 2017-03-18 DIAGNOSIS — E039 Hypothyroidism, unspecified: Secondary | ICD-10-CM | POA: Diagnosis not present

## 2017-03-18 DIAGNOSIS — E119 Type 2 diabetes mellitus without complications: Secondary | ICD-10-CM | POA: Diagnosis not present

## 2017-03-20 DIAGNOSIS — M6281 Muscle weakness (generalized): Secondary | ICD-10-CM | POA: Diagnosis not present

## 2017-04-01 ENCOUNTER — Other Ambulatory Visit: Payer: Self-pay | Admitting: Licensed Clinical Social Worker

## 2017-04-01 NOTE — Patient Outreach (Signed)
Assessment:  CSW spoke via phone with client.CSW verified identity of client.  CSW received verbal permission from client on 04/01/17 for CSW to speak with client about client needs. CSW spoke with client about client care plan. CSW encouraged client to continue to communicate with CSW in next 30 days about community resources to help client.Client is caring for her spouse who has Alzeheimer's Disease. CSW talked with client about food pantry support in area and about transport help in the area. CSW encouraged Vanessa Atkins to take time to care for herself and her needs. CSW encouraged Vanessa Atkins to attend, when able, the Alzheimer's support group in area. CSW thanked client for phone call with CSW on 04/01/17.    Plan:  Client to continue to talk with CSW in next 30 days about community resources to help client.  CSW to call client in 4 weeks to assess client needs.  [name].Erdem Naas MSW, LCSW Licensed Clinical Social Worker [facility] Care Management [phone]

## 2017-04-19 DIAGNOSIS — M6281 Muscle weakness (generalized): Secondary | ICD-10-CM | POA: Diagnosis not present

## 2017-05-01 ENCOUNTER — Other Ambulatory Visit: Payer: Self-pay | Admitting: Licensed Clinical Social Worker

## 2017-05-01 NOTE — Patient Outreach (Signed)
Assessment:  CSW spoke via phone with client. CSW verified client identity. CSW received verbal permission from client for CSW to speak with client about client needs. CSW spoke with Vanessa Atkins about client  care plan.  CSW encouraged Vanessa Atkins to continue to talk with CSW in next 30 days to discuss various community resources to possibly assist client at this time. CSW again encouraged Vanessa Atkins to attend Alzheimer's Caregivers Support group when she is able. CSW talked with Vanessa Atkins about [employer] (food pantry), [employer] (food pantry) and about [employer]. CSW talked with Vanessa Atkins about self care and taking time to manage stress and anxiety. Vanessa Atkins said she is main caregiver for her spouse. Her spouse has Alzheimer's diagnosis.   CSW thanked client for phone call with CSW on 05/01/17. CSW encouraged client to call CSW as needed to discuss social work needs of client    Plan:  Client to continue to talk with CSW in next 30 days to discuss various community resource to possibly assist Vanessa Atkins at this time.  CSW to call client in 4 weeks to assess client needs.  Vanessa Atkins.Vanessa Atkins MSW, LCSW Licensed Clinical Social Worker [facility] Care Management [phone]

## 2017-05-20 DIAGNOSIS — M6281 Muscle weakness (generalized): Secondary | ICD-10-CM | POA: Diagnosis not present

## 2017-06-03 ENCOUNTER — Other Ambulatory Visit: Payer: Self-pay | Admitting: Licensed Clinical Social Worker

## 2017-06-03 NOTE — Patient Outreach (Signed)
Assessment:  CSW spoke via phone with Vanessa Atkins. CSW verified identity of Vanessa Atkins. CSW received verbal permission from Vanessa Atkins for Vanessa Atkins to speak with Vanessa Atkins about her current needs. Client sees Dr. Nevada Crane as primary care doctor. CSW spoke with client about client care plan. CSW encouraged client to talk with CSW in next 30 days to discuss various community resources to possibly assist Vanessa Atkins at this time. CSW previously talked with client about two local food pantries to assist with food needs of client. CSW talked with client about local transport assist agency. CSW encouraged client to continue to attend Alzheimer's Caregivers support group in area.  Vanessa Atkins said that she uses walker to ambulate. She said her spouse has Alzheimer's disease and she is main caregiver for her spouse.  CSW have talked with client about self care issues and use of relaxation techniques to help her manage stress/anxiety. CSW encouraged Vanessa Atkins to try to access some of the community resources CSW had mentioned to her in recent weeks. CSW thanked client for phone call with CSW on 06/03/17.    Plan:  Client to talk with CSW in next 30 days to discuss various community resources to possibly assist Vanessa Atkins at this time.   CSW to call Vanessa Atkins in 4 weeks to assess client needs at that time.  [name].Ronold Hardgrove MSW, LCSW Licensed Clinical Social Worker [facility] Care Management [phone]

## 2017-06-19 DIAGNOSIS — M6281 Muscle weakness (generalized): Secondary | ICD-10-CM | POA: Diagnosis not present

## 2017-07-07 ENCOUNTER — Encounter: Payer: Self-pay | Admitting: Licensed Clinical Social Worker

## 2017-07-07 ENCOUNTER — Other Ambulatory Visit: Payer: Self-pay | Admitting: Licensed Clinical Social Worker

## 2017-07-07 NOTE — Patient Outreach (Signed)
Assessment:  CSW spoke via phone with client. CSW verified client identity. CSW received verbal permission from client for CSW to speak with client about client needs. CSW informed client on 07/07/17 that client  had met her care plan goals with [facility] CSW services. Thus, CSW informed client on 07/07/17 that [facility] would discharge client from [place] services on 07/07/17 since she had met her care plan goals. Client agreed to this plan. Client said she had no transport needs and had adequate food supply. She said she had her prescribed medications and is taking medications as prescribed. CSW congratulated client on meeting her care plan goals with CSW services. CSW thanked [place] for phone call with CSW on 07/07/17. Sharise was appreciative of phone call from [place] on 07/07/17.   Plan:  CSW is discharging Emilio Math from [facility] CSW services on 07/07/17 since client has met client's care plan goals with CSW services  CSW to fax physician case closure letter to Dr. Nevada Crane informing Dr. Nevada Crane that [place] discharged client on 07/07/17 from [place] services.  [name].Sandara Tyree MSW, LCSW Licensed Clinical Social Worker [facility] Care Management [phone]                  Plan:

## 2017-07-15 DIAGNOSIS — Z23 Encounter for immunization: Secondary | ICD-10-CM | POA: Diagnosis not present

## 2017-07-15 DIAGNOSIS — F5101 Primary insomnia: Secondary | ICD-10-CM | POA: Diagnosis not present

## 2017-07-15 DIAGNOSIS — R296 Repeated falls: Secondary | ICD-10-CM | POA: Diagnosis not present

## 2017-07-15 DIAGNOSIS — F330 Major depressive disorder, recurrent, mild: Secondary | ICD-10-CM | POA: Diagnosis not present

## 2017-07-15 DIAGNOSIS — B372 Candidiasis of skin and nail: Secondary | ICD-10-CM | POA: Diagnosis not present

## 2017-07-15 DIAGNOSIS — E039 Hypothyroidism, unspecified: Secondary | ICD-10-CM | POA: Diagnosis not present

## 2017-07-15 DIAGNOSIS — E119 Type 2 diabetes mellitus without complications: Secondary | ICD-10-CM | POA: Diagnosis not present

## 2017-07-15 DIAGNOSIS — M545 Low back pain: Secondary | ICD-10-CM | POA: Diagnosis not present

## 2017-07-20 DIAGNOSIS — M6281 Muscle weakness (generalized): Secondary | ICD-10-CM | POA: Diagnosis not present

## 2017-07-22 DIAGNOSIS — E039 Hypothyroidism, unspecified: Secondary | ICD-10-CM | POA: Diagnosis not present

## 2017-07-22 DIAGNOSIS — F419 Anxiety disorder, unspecified: Secondary | ICD-10-CM | POA: Diagnosis not present

## 2017-07-22 DIAGNOSIS — L89892 Pressure ulcer of other site, stage 2: Secondary | ICD-10-CM | POA: Diagnosis not present

## 2017-07-22 DIAGNOSIS — G894 Chronic pain syndrome: Secondary | ICD-10-CM | POA: Diagnosis not present

## 2017-07-22 DIAGNOSIS — R2240 Localized swelling, mass and lump, unspecified lower limb: Secondary | ICD-10-CM | POA: Diagnosis not present

## 2017-07-22 DIAGNOSIS — N3281 Overactive bladder: Secondary | ICD-10-CM | POA: Diagnosis not present

## 2017-07-22 DIAGNOSIS — I10 Essential (primary) hypertension: Secondary | ICD-10-CM | POA: Diagnosis not present

## 2017-07-22 DIAGNOSIS — F331 Major depressive disorder, recurrent, moderate: Secondary | ICD-10-CM | POA: Diagnosis not present

## 2017-07-22 DIAGNOSIS — G9009 Other idiopathic peripheral autonomic neuropathy: Secondary | ICD-10-CM | POA: Diagnosis not present

## 2017-07-22 DIAGNOSIS — B356 Tinea cruris: Secondary | ICD-10-CM | POA: Diagnosis not present

## 2017-07-22 DIAGNOSIS — E1165 Type 2 diabetes mellitus with hyperglycemia: Secondary | ICD-10-CM | POA: Diagnosis not present

## 2017-07-23 DIAGNOSIS — M4127 Other idiopathic scoliosis, lumbosacral region: Secondary | ICD-10-CM | POA: Diagnosis not present

## 2017-07-23 DIAGNOSIS — M47817 Spondylosis without myelopathy or radiculopathy, lumbosacral region: Secondary | ICD-10-CM | POA: Diagnosis not present

## 2017-08-04 DIAGNOSIS — H26493 Other secondary cataract, bilateral: Secondary | ICD-10-CM | POA: Diagnosis not present

## 2017-08-04 DIAGNOSIS — H02203 Unspecified lagophthalmos right eye, unspecified eyelid: Secondary | ICD-10-CM | POA: Diagnosis not present

## 2017-08-04 DIAGNOSIS — H02206 Unspecified lagophthalmos left eye, unspecified eyelid: Secondary | ICD-10-CM | POA: Diagnosis not present

## 2017-08-04 DIAGNOSIS — H04123 Dry eye syndrome of bilateral lacrimal glands: Secondary | ICD-10-CM | POA: Diagnosis not present

## 2017-08-04 DIAGNOSIS — E119 Type 2 diabetes mellitus without complications: Secondary | ICD-10-CM | POA: Diagnosis not present

## 2017-08-04 DIAGNOSIS — Z961 Presence of intraocular lens: Secondary | ICD-10-CM | POA: Diagnosis not present

## 2017-08-15 DIAGNOSIS — E039 Hypothyroidism, unspecified: Secondary | ICD-10-CM | POA: Diagnosis not present

## 2017-08-15 DIAGNOSIS — F419 Anxiety disorder, unspecified: Secondary | ICD-10-CM | POA: Diagnosis not present

## 2017-08-15 DIAGNOSIS — N3281 Overactive bladder: Secondary | ICD-10-CM | POA: Diagnosis not present

## 2017-08-15 DIAGNOSIS — E1165 Type 2 diabetes mellitus with hyperglycemia: Secondary | ICD-10-CM | POA: Diagnosis not present

## 2017-08-15 DIAGNOSIS — F330 Major depressive disorder, recurrent, mild: Secondary | ICD-10-CM | POA: Diagnosis not present

## 2017-08-15 DIAGNOSIS — I10 Essential (primary) hypertension: Secondary | ICD-10-CM | POA: Diagnosis not present

## 2017-08-15 DIAGNOSIS — F331 Major depressive disorder, recurrent, moderate: Secondary | ICD-10-CM | POA: Diagnosis not present

## 2017-08-15 DIAGNOSIS — E119 Type 2 diabetes mellitus without complications: Secondary | ICD-10-CM | POA: Diagnosis not present

## 2017-08-19 DIAGNOSIS — M6281 Muscle weakness (generalized): Secondary | ICD-10-CM | POA: Diagnosis not present

## 2017-09-04 DIAGNOSIS — H04123 Dry eye syndrome of bilateral lacrimal glands: Secondary | ICD-10-CM | POA: Diagnosis not present

## 2017-09-04 DIAGNOSIS — Z961 Presence of intraocular lens: Secondary | ICD-10-CM | POA: Diagnosis not present

## 2017-09-04 DIAGNOSIS — H02206 Unspecified lagophthalmos left eye, unspecified eyelid: Secondary | ICD-10-CM | POA: Diagnosis not present

## 2017-09-04 DIAGNOSIS — E119 Type 2 diabetes mellitus without complications: Secondary | ICD-10-CM | POA: Diagnosis not present

## 2017-09-04 DIAGNOSIS — H26492 Other secondary cataract, left eye: Secondary | ICD-10-CM | POA: Diagnosis not present

## 2017-09-04 DIAGNOSIS — H02203 Unspecified lagophthalmos right eye, unspecified eyelid: Secondary | ICD-10-CM | POA: Diagnosis not present

## 2017-09-04 DIAGNOSIS — H26491 Other secondary cataract, right eye: Secondary | ICD-10-CM | POA: Diagnosis not present

## 2017-09-19 DIAGNOSIS — F419 Anxiety disorder, unspecified: Secondary | ICD-10-CM | POA: Diagnosis not present

## 2017-09-19 DIAGNOSIS — F5101 Primary insomnia: Secondary | ICD-10-CM | POA: Diagnosis not present

## 2017-09-19 DIAGNOSIS — F331 Major depressive disorder, recurrent, moderate: Secondary | ICD-10-CM | POA: Diagnosis not present

## 2017-09-19 DIAGNOSIS — M6281 Muscle weakness (generalized): Secondary | ICD-10-CM | POA: Diagnosis not present

## 2017-09-19 DIAGNOSIS — G9009 Other idiopathic peripheral autonomic neuropathy: Secondary | ICD-10-CM | POA: Diagnosis not present

## 2017-09-19 DIAGNOSIS — G894 Chronic pain syndrome: Secondary | ICD-10-CM | POA: Diagnosis not present

## 2017-09-19 DIAGNOSIS — I10 Essential (primary) hypertension: Secondary | ICD-10-CM | POA: Diagnosis not present

## 2017-09-19 DIAGNOSIS — E1165 Type 2 diabetes mellitus with hyperglycemia: Secondary | ICD-10-CM | POA: Diagnosis not present

## 2017-09-19 DIAGNOSIS — E039 Hypothyroidism, unspecified: Secondary | ICD-10-CM | POA: Diagnosis not present

## 2017-09-19 DIAGNOSIS — F330 Major depressive disorder, recurrent, mild: Secondary | ICD-10-CM | POA: Diagnosis not present

## 2017-10-20 DIAGNOSIS — M6281 Muscle weakness (generalized): Secondary | ICD-10-CM | POA: Diagnosis not present

## 2017-10-28 DIAGNOSIS — M47817 Spondylosis without myelopathy or radiculopathy, lumbosacral region: Secondary | ICD-10-CM | POA: Diagnosis not present

## 2017-10-28 DIAGNOSIS — M4127 Other idiopathic scoliosis, lumbosacral region: Secondary | ICD-10-CM | POA: Diagnosis not present

## 2017-11-12 DIAGNOSIS — G894 Chronic pain syndrome: Secondary | ICD-10-CM | POA: Diagnosis not present

## 2017-11-12 DIAGNOSIS — E1165 Type 2 diabetes mellitus with hyperglycemia: Secondary | ICD-10-CM | POA: Diagnosis not present

## 2017-11-12 DIAGNOSIS — F419 Anxiety disorder, unspecified: Secondary | ICD-10-CM | POA: Diagnosis not present

## 2017-11-12 DIAGNOSIS — F331 Major depressive disorder, recurrent, moderate: Secondary | ICD-10-CM | POA: Diagnosis not present

## 2017-11-12 DIAGNOSIS — I10 Essential (primary) hypertension: Secondary | ICD-10-CM | POA: Diagnosis not present

## 2017-11-12 DIAGNOSIS — E039 Hypothyroidism, unspecified: Secondary | ICD-10-CM | POA: Diagnosis not present

## 2017-12-03 DIAGNOSIS — F330 Major depressive disorder, recurrent, mild: Secondary | ICD-10-CM | POA: Diagnosis not present

## 2017-12-03 DIAGNOSIS — E039 Hypothyroidism, unspecified: Secondary | ICD-10-CM | POA: Diagnosis not present

## 2017-12-03 DIAGNOSIS — E1165 Type 2 diabetes mellitus with hyperglycemia: Secondary | ICD-10-CM | POA: Diagnosis not present

## 2017-12-03 DIAGNOSIS — I10 Essential (primary) hypertension: Secondary | ICD-10-CM | POA: Diagnosis not present

## 2017-12-10 DIAGNOSIS — E1169 Type 2 diabetes mellitus with other specified complication: Secondary | ICD-10-CM | POA: Diagnosis not present

## 2017-12-10 DIAGNOSIS — G894 Chronic pain syndrome: Secondary | ICD-10-CM | POA: Diagnosis not present

## 2017-12-10 DIAGNOSIS — G9009 Other idiopathic peripheral autonomic neuropathy: Secondary | ICD-10-CM | POA: Diagnosis not present

## 2017-12-10 DIAGNOSIS — F419 Anxiety disorder, unspecified: Secondary | ICD-10-CM | POA: Diagnosis not present

## 2017-12-10 DIAGNOSIS — N3281 Overactive bladder: Secondary | ICD-10-CM | POA: Diagnosis not present

## 2017-12-10 DIAGNOSIS — E039 Hypothyroidism, unspecified: Secondary | ICD-10-CM | POA: Diagnosis not present

## 2017-12-10 DIAGNOSIS — Z0000 Encounter for general adult medical examination without abnormal findings: Secondary | ICD-10-CM | POA: Diagnosis not present

## 2017-12-10 DIAGNOSIS — F331 Major depressive disorder, recurrent, moderate: Secondary | ICD-10-CM | POA: Diagnosis not present

## 2017-12-10 DIAGNOSIS — I10 Essential (primary) hypertension: Secondary | ICD-10-CM | POA: Diagnosis not present

## 2017-12-29 DIAGNOSIS — E1169 Type 2 diabetes mellitus with other specified complication: Secondary | ICD-10-CM | POA: Diagnosis not present

## 2017-12-29 DIAGNOSIS — G9009 Other idiopathic peripheral autonomic neuropathy: Secondary | ICD-10-CM | POA: Diagnosis not present

## 2017-12-29 DIAGNOSIS — I10 Essential (primary) hypertension: Secondary | ICD-10-CM | POA: Diagnosis not present

## 2017-12-29 DIAGNOSIS — G894 Chronic pain syndrome: Secondary | ICD-10-CM | POA: Diagnosis not present

## 2017-12-29 DIAGNOSIS — E039 Hypothyroidism, unspecified: Secondary | ICD-10-CM | POA: Diagnosis not present

## 2020-03-30 DIAGNOSIS — R5383 Other fatigue: Secondary | ICD-10-CM | POA: Diagnosis not present

## 2020-03-30 DIAGNOSIS — E0590 Thyrotoxicosis, unspecified without thyrotoxic crisis or storm: Secondary | ICD-10-CM | POA: Diagnosis not present

## 2020-03-30 DIAGNOSIS — Z0000 Encounter for general adult medical examination without abnormal findings: Secondary | ICD-10-CM | POA: Diagnosis not present

## 2020-03-30 DIAGNOSIS — E1165 Type 2 diabetes mellitus with hyperglycemia: Secondary | ICD-10-CM | POA: Diagnosis not present

## 2020-03-30 DIAGNOSIS — E7800 Pure hypercholesterolemia, unspecified: Secondary | ICD-10-CM | POA: Diagnosis not present

## 2020-03-30 DIAGNOSIS — Z79899 Other long term (current) drug therapy: Secondary | ICD-10-CM | POA: Diagnosis not present

## 2020-03-30 DIAGNOSIS — Z7189 Other specified counseling: Secondary | ICD-10-CM | POA: Diagnosis not present

## 2020-03-30 DIAGNOSIS — Z299 Encounter for prophylactic measures, unspecified: Secondary | ICD-10-CM | POA: Diagnosis not present

## 2020-07-20 DIAGNOSIS — D692 Other nonthrombocytopenic purpura: Secondary | ICD-10-CM | POA: Diagnosis not present

## 2020-07-20 DIAGNOSIS — Z299 Encounter for prophylactic measures, unspecified: Secondary | ICD-10-CM | POA: Diagnosis not present

## 2020-07-20 DIAGNOSIS — E1165 Type 2 diabetes mellitus with hyperglycemia: Secondary | ICD-10-CM | POA: Diagnosis not present

## 2020-07-20 DIAGNOSIS — N3281 Overactive bladder: Secondary | ICD-10-CM | POA: Diagnosis not present

## 2020-10-04 DIAGNOSIS — M546 Pain in thoracic spine: Secondary | ICD-10-CM | POA: Diagnosis not present

## 2020-10-04 DIAGNOSIS — G8929 Other chronic pain: Secondary | ICD-10-CM | POA: Diagnosis not present

## 2020-10-04 DIAGNOSIS — Z9889 Other specified postprocedural states: Secondary | ICD-10-CM | POA: Diagnosis not present

## 2020-10-04 DIAGNOSIS — M5441 Lumbago with sciatica, right side: Secondary | ICD-10-CM | POA: Diagnosis not present

## 2020-10-04 DIAGNOSIS — G894 Chronic pain syndrome: Secondary | ICD-10-CM | POA: Diagnosis not present

## 2020-10-30 DIAGNOSIS — Z23 Encounter for immunization: Secondary | ICD-10-CM | POA: Diagnosis not present

## 2020-10-30 DIAGNOSIS — E1165 Type 2 diabetes mellitus with hyperglycemia: Secondary | ICD-10-CM | POA: Diagnosis not present

## 2020-10-30 DIAGNOSIS — Z789 Other specified health status: Secondary | ICD-10-CM | POA: Diagnosis not present

## 2020-10-30 DIAGNOSIS — Z299 Encounter for prophylactic measures, unspecified: Secondary | ICD-10-CM | POA: Diagnosis not present

## 2020-10-30 DIAGNOSIS — G8929 Other chronic pain: Secondary | ICD-10-CM | POA: Diagnosis not present

## 2020-10-30 DIAGNOSIS — M549 Dorsalgia, unspecified: Secondary | ICD-10-CM | POA: Diagnosis not present

## 2020-11-01 DIAGNOSIS — M549 Dorsalgia, unspecified: Secondary | ICD-10-CM | POA: Diagnosis not present

## 2020-11-17 DIAGNOSIS — G8929 Other chronic pain: Secondary | ICD-10-CM | POA: Diagnosis not present

## 2020-11-17 DIAGNOSIS — M549 Dorsalgia, unspecified: Secondary | ICD-10-CM | POA: Diagnosis not present

## 2020-11-17 DIAGNOSIS — R52 Pain, unspecified: Secondary | ICD-10-CM | POA: Diagnosis not present

## 2020-11-17 DIAGNOSIS — E11649 Type 2 diabetes mellitus with hypoglycemia without coma: Secondary | ICD-10-CM | POA: Diagnosis not present

## 2020-11-17 DIAGNOSIS — Z299 Encounter for prophylactic measures, unspecified: Secondary | ICD-10-CM | POA: Diagnosis not present

## 2021-04-02 DIAGNOSIS — Z0000 Encounter for general adult medical examination without abnormal findings: Secondary | ICD-10-CM | POA: Diagnosis not present

## 2021-04-02 DIAGNOSIS — Z7189 Other specified counseling: Secondary | ICD-10-CM | POA: Diagnosis not present

## 2021-04-02 DIAGNOSIS — Z789 Other specified health status: Secondary | ICD-10-CM | POA: Diagnosis not present

## 2021-04-02 DIAGNOSIS — R5383 Other fatigue: Secondary | ICD-10-CM | POA: Diagnosis not present

## 2021-04-02 DIAGNOSIS — Z79899 Other long term (current) drug therapy: Secondary | ICD-10-CM | POA: Diagnosis not present

## 2021-04-02 DIAGNOSIS — E7800 Pure hypercholesterolemia, unspecified: Secondary | ICD-10-CM | POA: Diagnosis not present

## 2021-04-02 DIAGNOSIS — Z299 Encounter for prophylactic measures, unspecified: Secondary | ICD-10-CM | POA: Diagnosis not present

## 2021-04-13 DIAGNOSIS — M7989 Other specified soft tissue disorders: Secondary | ICD-10-CM | POA: Diagnosis not present

## 2021-04-13 DIAGNOSIS — M79604 Pain in right leg: Secondary | ICD-10-CM | POA: Diagnosis not present

## 2021-04-13 DIAGNOSIS — M25469 Effusion, unspecified knee: Secondary | ICD-10-CM | POA: Diagnosis not present

## 2021-04-13 DIAGNOSIS — M79605 Pain in left leg: Secondary | ICD-10-CM | POA: Diagnosis not present

## 2021-04-18 DIAGNOSIS — Z299 Encounter for prophylactic measures, unspecified: Secondary | ICD-10-CM | POA: Diagnosis not present

## 2021-04-18 DIAGNOSIS — R5383 Other fatigue: Secondary | ICD-10-CM | POA: Diagnosis not present

## 2021-04-18 DIAGNOSIS — E7800 Pure hypercholesterolemia, unspecified: Secondary | ICD-10-CM | POA: Diagnosis not present

## 2021-04-18 DIAGNOSIS — R600 Localized edema: Secondary | ICD-10-CM | POA: Diagnosis not present

## 2021-04-23 DIAGNOSIS — R600 Localized edema: Secondary | ICD-10-CM | POA: Diagnosis not present

## 2021-05-02 DIAGNOSIS — M545 Low back pain, unspecified: Secondary | ICD-10-CM | POA: Diagnosis not present

## 2021-05-02 DIAGNOSIS — Z299 Encounter for prophylactic measures, unspecified: Secondary | ICD-10-CM | POA: Diagnosis not present

## 2021-05-02 DIAGNOSIS — M1990 Unspecified osteoarthritis, unspecified site: Secondary | ICD-10-CM | POA: Diagnosis not present

## 2021-05-02 DIAGNOSIS — Z79899 Other long term (current) drug therapy: Secondary | ICD-10-CM | POA: Diagnosis not present

## 2021-05-12 DIAGNOSIS — B3749 Other urogenital candidiasis: Secondary | ICD-10-CM | POA: Diagnosis not present

## 2021-05-15 DIAGNOSIS — R609 Edema, unspecified: Secondary | ICD-10-CM | POA: Diagnosis not present

## 2021-05-15 DIAGNOSIS — I3481 Nonrheumatic mitral (valve) annulus calcification: Secondary | ICD-10-CM | POA: Diagnosis not present

## 2021-05-15 DIAGNOSIS — I517 Cardiomegaly: Secondary | ICD-10-CM | POA: Diagnosis not present

## 2021-06-14 DIAGNOSIS — E11649 Type 2 diabetes mellitus with hypoglycemia without coma: Secondary | ICD-10-CM | POA: Diagnosis not present

## 2021-06-14 DIAGNOSIS — G4700 Insomnia, unspecified: Secondary | ICD-10-CM | POA: Diagnosis not present

## 2021-06-14 DIAGNOSIS — M545 Low back pain, unspecified: Secondary | ICD-10-CM | POA: Diagnosis not present

## 2021-06-14 DIAGNOSIS — Z299 Encounter for prophylactic measures, unspecified: Secondary | ICD-10-CM | POA: Diagnosis not present

## 2021-07-02 DIAGNOSIS — R600 Localized edema: Secondary | ICD-10-CM | POA: Diagnosis not present

## 2021-07-02 DIAGNOSIS — L0390 Cellulitis, unspecified: Secondary | ICD-10-CM | POA: Diagnosis not present

## 2021-07-02 DIAGNOSIS — E11649 Type 2 diabetes mellitus with hypoglycemia without coma: Secondary | ICD-10-CM | POA: Diagnosis not present

## 2021-07-02 DIAGNOSIS — Z299 Encounter for prophylactic measures, unspecified: Secondary | ICD-10-CM | POA: Diagnosis not present

## 2021-07-02 DIAGNOSIS — G4700 Insomnia, unspecified: Secondary | ICD-10-CM | POA: Diagnosis not present

## 2021-07-09 DIAGNOSIS — E11649 Type 2 diabetes mellitus with hypoglycemia without coma: Secondary | ICD-10-CM | POA: Diagnosis not present

## 2021-07-09 DIAGNOSIS — R600 Localized edema: Secondary | ICD-10-CM | POA: Diagnosis not present

## 2021-07-09 DIAGNOSIS — Z299 Encounter for prophylactic measures, unspecified: Secondary | ICD-10-CM | POA: Diagnosis not present

## 2021-07-25 DIAGNOSIS — L03119 Cellulitis of unspecified part of limb: Secondary | ICD-10-CM | POA: Diagnosis not present

## 2021-07-25 DIAGNOSIS — G4700 Insomnia, unspecified: Secondary | ICD-10-CM | POA: Diagnosis not present

## 2021-07-25 DIAGNOSIS — E1165 Type 2 diabetes mellitus with hyperglycemia: Secondary | ICD-10-CM | POA: Diagnosis not present

## 2021-07-25 DIAGNOSIS — Z299 Encounter for prophylactic measures, unspecified: Secondary | ICD-10-CM | POA: Diagnosis not present

## 2021-07-25 DIAGNOSIS — M179 Osteoarthritis of knee, unspecified: Secondary | ICD-10-CM | POA: Diagnosis not present

## 2021-08-09 DIAGNOSIS — Z299 Encounter for prophylactic measures, unspecified: Secondary | ICD-10-CM | POA: Diagnosis not present

## 2021-08-09 DIAGNOSIS — B379 Candidiasis, unspecified: Secondary | ICD-10-CM | POA: Diagnosis not present

## 2021-08-13 DIAGNOSIS — M25559 Pain in unspecified hip: Secondary | ICD-10-CM | POA: Diagnosis not present

## 2021-08-13 DIAGNOSIS — E039 Hypothyroidism, unspecified: Secondary | ICD-10-CM | POA: Diagnosis not present

## 2021-08-13 DIAGNOSIS — Z299 Encounter for prophylactic measures, unspecified: Secondary | ICD-10-CM | POA: Diagnosis not present

## 2021-08-13 DIAGNOSIS — M5431 Sciatica, right side: Secondary | ICD-10-CM | POA: Diagnosis not present

## 2021-08-13 DIAGNOSIS — Z789 Other specified health status: Secondary | ICD-10-CM | POA: Diagnosis not present

## 2021-08-22 DIAGNOSIS — G8929 Other chronic pain: Secondary | ICD-10-CM | POA: Diagnosis not present

## 2021-08-22 DIAGNOSIS — Z299 Encounter for prophylactic measures, unspecified: Secondary | ICD-10-CM | POA: Diagnosis not present

## 2021-08-22 DIAGNOSIS — M549 Dorsalgia, unspecified: Secondary | ICD-10-CM | POA: Diagnosis not present

## 2021-08-22 DIAGNOSIS — D692 Other nonthrombocytopenic purpura: Secondary | ICD-10-CM | POA: Diagnosis not present

## 2021-08-22 DIAGNOSIS — L03116 Cellulitis of left lower limb: Secondary | ICD-10-CM | POA: Diagnosis not present

## 2021-09-08 DIAGNOSIS — L03213 Periorbital cellulitis: Secondary | ICD-10-CM | POA: Diagnosis not present

## 2021-09-18 DIAGNOSIS — H109 Unspecified conjunctivitis: Secondary | ICD-10-CM | POA: Diagnosis not present

## 2021-09-18 DIAGNOSIS — Z299 Encounter for prophylactic measures, unspecified: Secondary | ICD-10-CM | POA: Diagnosis not present

## 2021-09-18 DIAGNOSIS — E1165 Type 2 diabetes mellitus with hyperglycemia: Secondary | ICD-10-CM | POA: Diagnosis not present

## 2021-10-01 DIAGNOSIS — L509 Urticaria, unspecified: Secondary | ICD-10-CM | POA: Diagnosis not present

## 2021-10-01 DIAGNOSIS — L03115 Cellulitis of right lower limb: Secondary | ICD-10-CM | POA: Diagnosis not present

## 2021-10-01 DIAGNOSIS — L03116 Cellulitis of left lower limb: Secondary | ICD-10-CM | POA: Diagnosis not present

## 2021-10-01 DIAGNOSIS — Z299 Encounter for prophylactic measures, unspecified: Secondary | ICD-10-CM | POA: Diagnosis not present

## 2021-10-16 DIAGNOSIS — E119 Type 2 diabetes mellitus without complications: Secondary | ICD-10-CM | POA: Diagnosis not present

## 2022-11-05 DIAGNOSIS — M4312 Spondylolisthesis, cervical region: Secondary | ICD-10-CM | POA: Diagnosis not present

## 2022-11-05 DIAGNOSIS — Z981 Arthrodesis status: Secondary | ICD-10-CM | POA: Diagnosis not present

## 2022-11-05 DIAGNOSIS — M50322 Other cervical disc degeneration at C5-C6 level: Secondary | ICD-10-CM | POA: Diagnosis not present

## 2022-11-05 DIAGNOSIS — M50323 Other cervical disc degeneration at C6-C7 level: Secondary | ICD-10-CM | POA: Diagnosis not present

## 2022-11-05 DIAGNOSIS — M8588 Other specified disorders of bone density and structure, other site: Secondary | ICD-10-CM | POA: Diagnosis not present

## 2022-11-12 DIAGNOSIS — D500 Iron deficiency anemia secondary to blood loss (chronic): Secondary | ICD-10-CM | POA: Diagnosis not present

## 2022-11-12 DIAGNOSIS — K922 Gastrointestinal hemorrhage, unspecified: Secondary | ICD-10-CM | POA: Diagnosis not present

## 2022-11-12 DIAGNOSIS — K279 Peptic ulcer, site unspecified, unspecified as acute or chronic, without hemorrhage or perforation: Secondary | ICD-10-CM | POA: Diagnosis not present

## 2022-11-12 DIAGNOSIS — Z8719 Personal history of other diseases of the digestive system: Secondary | ICD-10-CM | POA: Diagnosis not present

## 2022-11-13 DIAGNOSIS — E1165 Type 2 diabetes mellitus with hyperglycemia: Secondary | ICD-10-CM | POA: Diagnosis not present

## 2022-11-13 DIAGNOSIS — K259 Gastric ulcer, unspecified as acute or chronic, without hemorrhage or perforation: Secondary | ICD-10-CM | POA: Diagnosis not present

## 2022-11-13 DIAGNOSIS — M545 Low back pain, unspecified: Secondary | ICD-10-CM | POA: Diagnosis not present

## 2022-11-13 DIAGNOSIS — Z299 Encounter for prophylactic measures, unspecified: Secondary | ICD-10-CM | POA: Diagnosis not present

## 2022-11-13 DIAGNOSIS — J449 Chronic obstructive pulmonary disease, unspecified: Secondary | ICD-10-CM | POA: Diagnosis not present

## 2022-12-17 DIAGNOSIS — E039 Hypothyroidism, unspecified: Secondary | ICD-10-CM | POA: Diagnosis not present

## 2022-12-17 DIAGNOSIS — Z299 Encounter for prophylactic measures, unspecified: Secondary | ICD-10-CM | POA: Diagnosis not present

## 2022-12-17 DIAGNOSIS — Z79899 Other long term (current) drug therapy: Secondary | ICD-10-CM | POA: Diagnosis not present

## 2022-12-17 DIAGNOSIS — R5383 Other fatigue: Secondary | ICD-10-CM | POA: Diagnosis not present

## 2022-12-17 DIAGNOSIS — J449 Chronic obstructive pulmonary disease, unspecified: Secondary | ICD-10-CM | POA: Diagnosis not present

## 2022-12-17 DIAGNOSIS — Z0000 Encounter for general adult medical examination without abnormal findings: Secondary | ICD-10-CM | POA: Diagnosis not present

## 2022-12-17 DIAGNOSIS — E7800 Pure hypercholesterolemia, unspecified: Secondary | ICD-10-CM | POA: Diagnosis not present

## 2022-12-17 DIAGNOSIS — Z7189 Other specified counseling: Secondary | ICD-10-CM | POA: Diagnosis not present

## 2023-01-03 DIAGNOSIS — H26493 Other secondary cataract, bilateral: Secondary | ICD-10-CM | POA: Diagnosis not present

## 2023-01-27 DIAGNOSIS — Z91041 Radiographic dye allergy status: Secondary | ICD-10-CM | POA: Diagnosis not present

## 2023-01-27 DIAGNOSIS — S199XXA Unspecified injury of neck, initial encounter: Secondary | ICD-10-CM | POA: Diagnosis not present

## 2023-01-27 DIAGNOSIS — E119 Type 2 diabetes mellitus without complications: Secondary | ICD-10-CM | POA: Diagnosis not present

## 2023-01-27 DIAGNOSIS — M79605 Pain in left leg: Secondary | ICD-10-CM | POA: Diagnosis not present

## 2023-01-27 DIAGNOSIS — Z7901 Long term (current) use of anticoagulants: Secondary | ICD-10-CM | POA: Diagnosis not present

## 2023-01-27 DIAGNOSIS — Z9089 Acquired absence of other organs: Secondary | ICD-10-CM | POA: Diagnosis not present

## 2023-01-27 DIAGNOSIS — M1612 Unilateral primary osteoarthritis, left hip: Secondary | ICD-10-CM | POA: Diagnosis not present

## 2023-01-27 DIAGNOSIS — M25572 Pain in left ankle and joints of left foot: Secondary | ICD-10-CM | POA: Diagnosis not present

## 2023-01-27 DIAGNOSIS — Z886 Allergy status to analgesic agent status: Secondary | ICD-10-CM | POA: Diagnosis not present

## 2023-01-27 DIAGNOSIS — Z888 Allergy status to other drugs, medicaments and biological substances status: Secondary | ICD-10-CM | POA: Diagnosis not present

## 2023-01-27 DIAGNOSIS — Z043 Encounter for examination and observation following other accident: Secondary | ICD-10-CM | POA: Diagnosis not present

## 2023-01-27 DIAGNOSIS — Z853 Personal history of malignant neoplasm of breast: Secondary | ICD-10-CM | POA: Diagnosis not present

## 2023-01-27 DIAGNOSIS — Z8673 Personal history of transient ischemic attack (TIA), and cerebral infarction without residual deficits: Secondary | ICD-10-CM | POA: Diagnosis not present

## 2023-01-27 DIAGNOSIS — W1839XA Other fall on same level, initial encounter: Secondary | ICD-10-CM | POA: Diagnosis not present

## 2023-01-27 DIAGNOSIS — S0990XA Unspecified injury of head, initial encounter: Secondary | ICD-10-CM | POA: Diagnosis not present

## 2023-01-27 DIAGNOSIS — Z79899 Other long term (current) drug therapy: Secondary | ICD-10-CM | POA: Diagnosis not present

## 2023-01-27 DIAGNOSIS — M79672 Pain in left foot: Secondary | ICD-10-CM | POA: Diagnosis not present

## 2023-02-03 DIAGNOSIS — N181 Chronic kidney disease, stage 1: Secondary | ICD-10-CM | POA: Diagnosis not present

## 2023-02-03 DIAGNOSIS — G959 Disease of spinal cord, unspecified: Secondary | ICD-10-CM | POA: Diagnosis not present

## 2023-02-03 DIAGNOSIS — G894 Chronic pain syndrome: Secondary | ICD-10-CM | POA: Diagnosis not present

## 2023-02-03 DIAGNOSIS — M5412 Radiculopathy, cervical region: Secondary | ICD-10-CM | POA: Diagnosis not present

## 2023-02-03 DIAGNOSIS — M542 Cervicalgia: Secondary | ICD-10-CM | POA: Diagnosis not present

## 2023-02-03 DIAGNOSIS — E1122 Type 2 diabetes mellitus with diabetic chronic kidney disease: Secondary | ICD-10-CM | POA: Diagnosis not present

## 2023-02-05 DIAGNOSIS — Z7689 Persons encountering health services in other specified circumstances: Secondary | ICD-10-CM | POA: Diagnosis not present

## 2023-02-05 DIAGNOSIS — Z299 Encounter for prophylactic measures, unspecified: Secondary | ICD-10-CM | POA: Diagnosis not present

## 2023-02-05 DIAGNOSIS — J449 Chronic obstructive pulmonary disease, unspecified: Secondary | ICD-10-CM | POA: Diagnosis not present

## 2023-02-11 DIAGNOSIS — M542 Cervicalgia: Secondary | ICD-10-CM | POA: Diagnosis not present

## 2023-02-11 DIAGNOSIS — G959 Disease of spinal cord, unspecified: Secondary | ICD-10-CM | POA: Diagnosis not present

## 2023-02-11 DIAGNOSIS — Z981 Arthrodesis status: Secondary | ICD-10-CM | POA: Diagnosis not present

## 2023-03-05 DIAGNOSIS — E1122 Type 2 diabetes mellitus with diabetic chronic kidney disease: Secondary | ICD-10-CM | POA: Diagnosis not present

## 2023-03-05 DIAGNOSIS — N181 Chronic kidney disease, stage 1: Secondary | ICD-10-CM | POA: Diagnosis not present

## 2023-03-05 DIAGNOSIS — Z981 Arthrodesis status: Secondary | ICD-10-CM | POA: Diagnosis not present

## 2023-03-05 DIAGNOSIS — M419 Scoliosis, unspecified: Secondary | ICD-10-CM | POA: Diagnosis not present

## 2023-03-05 DIAGNOSIS — Z85828 Personal history of other malignant neoplasm of skin: Secondary | ICD-10-CM | POA: Diagnosis not present

## 2023-03-05 DIAGNOSIS — Z4789 Encounter for other orthopedic aftercare: Secondary | ICD-10-CM | POA: Diagnosis not present

## 2023-03-05 DIAGNOSIS — M51369 Other intervertebral disc degeneration, lumbar region without mention of lumbar back pain or lower extremity pain: Secondary | ICD-10-CM | POA: Diagnosis not present

## 2023-03-05 DIAGNOSIS — G4700 Insomnia, unspecified: Secondary | ICD-10-CM | POA: Diagnosis not present

## 2023-03-05 DIAGNOSIS — M4807 Spinal stenosis, lumbosacral region: Secondary | ICD-10-CM | POA: Diagnosis not present

## 2023-03-05 DIAGNOSIS — Z79891 Long term (current) use of opiate analgesic: Secondary | ICD-10-CM | POA: Diagnosis not present

## 2023-03-05 DIAGNOSIS — Z86718 Personal history of other venous thrombosis and embolism: Secondary | ICD-10-CM | POA: Diagnosis not present

## 2023-03-05 DIAGNOSIS — M810 Age-related osteoporosis without current pathological fracture: Secondary | ICD-10-CM | POA: Diagnosis not present

## 2023-03-05 DIAGNOSIS — G959 Disease of spinal cord, unspecified: Secondary | ICD-10-CM | POA: Diagnosis not present

## 2023-03-05 DIAGNOSIS — Z556 Problems related to health literacy: Secondary | ICD-10-CM | POA: Diagnosis not present

## 2023-03-05 DIAGNOSIS — E039 Hypothyroidism, unspecified: Secondary | ICD-10-CM | POA: Diagnosis not present

## 2023-03-05 DIAGNOSIS — I129 Hypertensive chronic kidney disease with stage 1 through stage 4 chronic kidney disease, or unspecified chronic kidney disease: Secondary | ICD-10-CM | POA: Diagnosis not present

## 2023-03-05 DIAGNOSIS — Z7984 Long term (current) use of oral hypoglycemic drugs: Secondary | ICD-10-CM | POA: Diagnosis not present

## 2023-03-06 DIAGNOSIS — Z981 Arthrodesis status: Secondary | ICD-10-CM | POA: Diagnosis not present

## 2023-03-10 DIAGNOSIS — Z86718 Personal history of other venous thrombosis and embolism: Secondary | ICD-10-CM | POA: Diagnosis not present

## 2023-03-10 DIAGNOSIS — G959 Disease of spinal cord, unspecified: Secondary | ICD-10-CM | POA: Diagnosis not present

## 2023-03-10 DIAGNOSIS — Z79891 Long term (current) use of opiate analgesic: Secondary | ICD-10-CM | POA: Diagnosis not present

## 2023-03-10 DIAGNOSIS — M810 Age-related osteoporosis without current pathological fracture: Secondary | ICD-10-CM | POA: Diagnosis not present

## 2023-03-10 DIAGNOSIS — N181 Chronic kidney disease, stage 1: Secondary | ICD-10-CM | POA: Diagnosis not present

## 2023-03-10 DIAGNOSIS — E1122 Type 2 diabetes mellitus with diabetic chronic kidney disease: Secondary | ICD-10-CM | POA: Diagnosis not present

## 2023-03-10 DIAGNOSIS — G4700 Insomnia, unspecified: Secondary | ICD-10-CM | POA: Diagnosis not present

## 2023-03-10 DIAGNOSIS — Z981 Arthrodesis status: Secondary | ICD-10-CM | POA: Diagnosis not present

## 2023-03-10 DIAGNOSIS — I129 Hypertensive chronic kidney disease with stage 1 through stage 4 chronic kidney disease, or unspecified chronic kidney disease: Secondary | ICD-10-CM | POA: Diagnosis not present

## 2023-03-10 DIAGNOSIS — M4807 Spinal stenosis, lumbosacral region: Secondary | ICD-10-CM | POA: Diagnosis not present

## 2023-03-10 DIAGNOSIS — M419 Scoliosis, unspecified: Secondary | ICD-10-CM | POA: Diagnosis not present

## 2023-03-10 DIAGNOSIS — M51369 Other intervertebral disc degeneration, lumbar region without mention of lumbar back pain or lower extremity pain: Secondary | ICD-10-CM | POA: Diagnosis not present

## 2023-03-10 DIAGNOSIS — Z4789 Encounter for other orthopedic aftercare: Secondary | ICD-10-CM | POA: Diagnosis not present

## 2023-03-10 DIAGNOSIS — E039 Hypothyroidism, unspecified: Secondary | ICD-10-CM | POA: Diagnosis not present

## 2023-03-10 DIAGNOSIS — Z85828 Personal history of other malignant neoplasm of skin: Secondary | ICD-10-CM | POA: Diagnosis not present

## 2023-03-10 DIAGNOSIS — Z7984 Long term (current) use of oral hypoglycemic drugs: Secondary | ICD-10-CM | POA: Diagnosis not present

## 2023-03-10 DIAGNOSIS — Z556 Problems related to health literacy: Secondary | ICD-10-CM | POA: Diagnosis not present

## 2023-03-20 DIAGNOSIS — M419 Scoliosis, unspecified: Secondary | ICD-10-CM | POA: Diagnosis not present

## 2023-03-20 DIAGNOSIS — Z7984 Long term (current) use of oral hypoglycemic drugs: Secondary | ICD-10-CM | POA: Diagnosis not present

## 2023-03-20 DIAGNOSIS — G959 Disease of spinal cord, unspecified: Secondary | ICD-10-CM | POA: Diagnosis not present

## 2023-03-20 DIAGNOSIS — I129 Hypertensive chronic kidney disease with stage 1 through stage 4 chronic kidney disease, or unspecified chronic kidney disease: Secondary | ICD-10-CM | POA: Diagnosis not present

## 2023-03-20 DIAGNOSIS — M51369 Other intervertebral disc degeneration, lumbar region without mention of lumbar back pain or lower extremity pain: Secondary | ICD-10-CM | POA: Diagnosis not present

## 2023-03-20 DIAGNOSIS — G4700 Insomnia, unspecified: Secondary | ICD-10-CM | POA: Diagnosis not present

## 2023-03-20 DIAGNOSIS — Z4789 Encounter for other orthopedic aftercare: Secondary | ICD-10-CM | POA: Diagnosis not present

## 2023-03-20 DIAGNOSIS — M810 Age-related osteoporosis without current pathological fracture: Secondary | ICD-10-CM | POA: Diagnosis not present

## 2023-03-20 DIAGNOSIS — Z79891 Long term (current) use of opiate analgesic: Secondary | ICD-10-CM | POA: Diagnosis not present

## 2023-03-20 DIAGNOSIS — E1122 Type 2 diabetes mellitus with diabetic chronic kidney disease: Secondary | ICD-10-CM | POA: Diagnosis not present

## 2023-03-20 DIAGNOSIS — Z981 Arthrodesis status: Secondary | ICD-10-CM | POA: Diagnosis not present

## 2023-03-20 DIAGNOSIS — Z85828 Personal history of other malignant neoplasm of skin: Secondary | ICD-10-CM | POA: Diagnosis not present

## 2023-03-20 DIAGNOSIS — Z556 Problems related to health literacy: Secondary | ICD-10-CM | POA: Diagnosis not present

## 2023-03-20 DIAGNOSIS — E039 Hypothyroidism, unspecified: Secondary | ICD-10-CM | POA: Diagnosis not present

## 2023-03-20 DIAGNOSIS — M4807 Spinal stenosis, lumbosacral region: Secondary | ICD-10-CM | POA: Diagnosis not present

## 2023-03-20 DIAGNOSIS — N181 Chronic kidney disease, stage 1: Secondary | ICD-10-CM | POA: Diagnosis not present

## 2023-03-20 DIAGNOSIS — Z86718 Personal history of other venous thrombosis and embolism: Secondary | ICD-10-CM | POA: Diagnosis not present

## 2023-03-21 DIAGNOSIS — Z85828 Personal history of other malignant neoplasm of skin: Secondary | ICD-10-CM | POA: Diagnosis not present

## 2023-03-21 DIAGNOSIS — Z79891 Long term (current) use of opiate analgesic: Secondary | ICD-10-CM | POA: Diagnosis not present

## 2023-03-21 DIAGNOSIS — I129 Hypertensive chronic kidney disease with stage 1 through stage 4 chronic kidney disease, or unspecified chronic kidney disease: Secondary | ICD-10-CM | POA: Diagnosis not present

## 2023-03-21 DIAGNOSIS — M419 Scoliosis, unspecified: Secondary | ICD-10-CM | POA: Diagnosis not present

## 2023-03-21 DIAGNOSIS — E039 Hypothyroidism, unspecified: Secondary | ICD-10-CM | POA: Diagnosis not present

## 2023-03-21 DIAGNOSIS — Z7984 Long term (current) use of oral hypoglycemic drugs: Secondary | ICD-10-CM | POA: Diagnosis not present

## 2023-03-21 DIAGNOSIS — Z86718 Personal history of other venous thrombosis and embolism: Secondary | ICD-10-CM | POA: Diagnosis not present

## 2023-03-21 DIAGNOSIS — G4700 Insomnia, unspecified: Secondary | ICD-10-CM | POA: Diagnosis not present

## 2023-03-21 DIAGNOSIS — M4807 Spinal stenosis, lumbosacral region: Secondary | ICD-10-CM | POA: Diagnosis not present

## 2023-03-21 DIAGNOSIS — Z4789 Encounter for other orthopedic aftercare: Secondary | ICD-10-CM | POA: Diagnosis not present

## 2023-03-21 DIAGNOSIS — N181 Chronic kidney disease, stage 1: Secondary | ICD-10-CM | POA: Diagnosis not present

## 2023-03-21 DIAGNOSIS — Z981 Arthrodesis status: Secondary | ICD-10-CM | POA: Diagnosis not present

## 2023-03-21 DIAGNOSIS — M810 Age-related osteoporosis without current pathological fracture: Secondary | ICD-10-CM | POA: Diagnosis not present

## 2023-03-21 DIAGNOSIS — G959 Disease of spinal cord, unspecified: Secondary | ICD-10-CM | POA: Diagnosis not present

## 2023-03-21 DIAGNOSIS — E1122 Type 2 diabetes mellitus with diabetic chronic kidney disease: Secondary | ICD-10-CM | POA: Diagnosis not present

## 2023-03-21 DIAGNOSIS — Z556 Problems related to health literacy: Secondary | ICD-10-CM | POA: Diagnosis not present

## 2023-03-21 DIAGNOSIS — M51369 Other intervertebral disc degeneration, lumbar region without mention of lumbar back pain or lower extremity pain: Secondary | ICD-10-CM | POA: Diagnosis not present

## 2023-03-24 DIAGNOSIS — Z981 Arthrodesis status: Secondary | ICD-10-CM | POA: Diagnosis not present

## 2023-03-24 DIAGNOSIS — Z4789 Encounter for other orthopedic aftercare: Secondary | ICD-10-CM | POA: Diagnosis not present

## 2023-03-24 DIAGNOSIS — Z7984 Long term (current) use of oral hypoglycemic drugs: Secondary | ICD-10-CM | POA: Diagnosis not present

## 2023-03-24 DIAGNOSIS — M419 Scoliosis, unspecified: Secondary | ICD-10-CM | POA: Diagnosis not present

## 2023-03-24 DIAGNOSIS — M4807 Spinal stenosis, lumbosacral region: Secondary | ICD-10-CM | POA: Diagnosis not present

## 2023-03-24 DIAGNOSIS — G4700 Insomnia, unspecified: Secondary | ICD-10-CM | POA: Diagnosis not present

## 2023-03-24 DIAGNOSIS — I129 Hypertensive chronic kidney disease with stage 1 through stage 4 chronic kidney disease, or unspecified chronic kidney disease: Secondary | ICD-10-CM | POA: Diagnosis not present

## 2023-03-24 DIAGNOSIS — N181 Chronic kidney disease, stage 1: Secondary | ICD-10-CM | POA: Diagnosis not present

## 2023-03-24 DIAGNOSIS — Z86718 Personal history of other venous thrombosis and embolism: Secondary | ICD-10-CM | POA: Diagnosis not present

## 2023-03-24 DIAGNOSIS — Z556 Problems related to health literacy: Secondary | ICD-10-CM | POA: Diagnosis not present

## 2023-03-24 DIAGNOSIS — M51369 Other intervertebral disc degeneration, lumbar region without mention of lumbar back pain or lower extremity pain: Secondary | ICD-10-CM | POA: Diagnosis not present

## 2023-03-24 DIAGNOSIS — M810 Age-related osteoporosis without current pathological fracture: Secondary | ICD-10-CM | POA: Diagnosis not present

## 2023-03-24 DIAGNOSIS — E039 Hypothyroidism, unspecified: Secondary | ICD-10-CM | POA: Diagnosis not present

## 2023-03-24 DIAGNOSIS — G959 Disease of spinal cord, unspecified: Secondary | ICD-10-CM | POA: Diagnosis not present

## 2023-03-24 DIAGNOSIS — Z79891 Long term (current) use of opiate analgesic: Secondary | ICD-10-CM | POA: Diagnosis not present

## 2023-03-24 DIAGNOSIS — Z85828 Personal history of other malignant neoplasm of skin: Secondary | ICD-10-CM | POA: Diagnosis not present

## 2023-03-24 DIAGNOSIS — E1122 Type 2 diabetes mellitus with diabetic chronic kidney disease: Secondary | ICD-10-CM | POA: Diagnosis not present

## 2023-04-01 DIAGNOSIS — M810 Age-related osteoporosis without current pathological fracture: Secondary | ICD-10-CM | POA: Diagnosis not present

## 2023-04-01 DIAGNOSIS — E039 Hypothyroidism, unspecified: Secondary | ICD-10-CM | POA: Diagnosis not present

## 2023-04-01 DIAGNOSIS — M51369 Other intervertebral disc degeneration, lumbar region without mention of lumbar back pain or lower extremity pain: Secondary | ICD-10-CM | POA: Diagnosis not present

## 2023-04-01 DIAGNOSIS — Z7984 Long term (current) use of oral hypoglycemic drugs: Secondary | ICD-10-CM | POA: Diagnosis not present

## 2023-04-01 DIAGNOSIS — M419 Scoliosis, unspecified: Secondary | ICD-10-CM | POA: Diagnosis not present

## 2023-04-01 DIAGNOSIS — Z4789 Encounter for other orthopedic aftercare: Secondary | ICD-10-CM | POA: Diagnosis not present

## 2023-04-01 DIAGNOSIS — M4807 Spinal stenosis, lumbosacral region: Secondary | ICD-10-CM | POA: Diagnosis not present

## 2023-04-01 DIAGNOSIS — G4700 Insomnia, unspecified: Secondary | ICD-10-CM | POA: Diagnosis not present

## 2023-04-01 DIAGNOSIS — Z86718 Personal history of other venous thrombosis and embolism: Secondary | ICD-10-CM | POA: Diagnosis not present

## 2023-04-01 DIAGNOSIS — N181 Chronic kidney disease, stage 1: Secondary | ICD-10-CM | POA: Diagnosis not present

## 2023-04-01 DIAGNOSIS — Z556 Problems related to health literacy: Secondary | ICD-10-CM | POA: Diagnosis not present

## 2023-04-01 DIAGNOSIS — Z85828 Personal history of other malignant neoplasm of skin: Secondary | ICD-10-CM | POA: Diagnosis not present

## 2023-04-01 DIAGNOSIS — Z79891 Long term (current) use of opiate analgesic: Secondary | ICD-10-CM | POA: Diagnosis not present

## 2023-04-01 DIAGNOSIS — G959 Disease of spinal cord, unspecified: Secondary | ICD-10-CM | POA: Diagnosis not present

## 2023-04-01 DIAGNOSIS — I129 Hypertensive chronic kidney disease with stage 1 through stage 4 chronic kidney disease, or unspecified chronic kidney disease: Secondary | ICD-10-CM | POA: Diagnosis not present

## 2023-04-01 DIAGNOSIS — E1122 Type 2 diabetes mellitus with diabetic chronic kidney disease: Secondary | ICD-10-CM | POA: Diagnosis not present

## 2023-04-01 DIAGNOSIS — Z981 Arthrodesis status: Secondary | ICD-10-CM | POA: Diagnosis not present

## 2023-04-02 DIAGNOSIS — Z981 Arthrodesis status: Secondary | ICD-10-CM | POA: Diagnosis not present

## 2023-04-02 DIAGNOSIS — E039 Hypothyroidism, unspecified: Secondary | ICD-10-CM | POA: Diagnosis not present

## 2023-04-02 DIAGNOSIS — Z79891 Long term (current) use of opiate analgesic: Secondary | ICD-10-CM | POA: Diagnosis not present

## 2023-04-02 DIAGNOSIS — E1122 Type 2 diabetes mellitus with diabetic chronic kidney disease: Secondary | ICD-10-CM | POA: Diagnosis not present

## 2023-04-02 DIAGNOSIS — G959 Disease of spinal cord, unspecified: Secondary | ICD-10-CM | POA: Diagnosis not present

## 2023-04-02 DIAGNOSIS — N181 Chronic kidney disease, stage 1: Secondary | ICD-10-CM | POA: Diagnosis not present

## 2023-04-02 DIAGNOSIS — M810 Age-related osteoporosis without current pathological fracture: Secondary | ICD-10-CM | POA: Diagnosis not present

## 2023-04-02 DIAGNOSIS — Z85828 Personal history of other malignant neoplasm of skin: Secondary | ICD-10-CM | POA: Diagnosis not present

## 2023-04-02 DIAGNOSIS — Z7984 Long term (current) use of oral hypoglycemic drugs: Secondary | ICD-10-CM | POA: Diagnosis not present

## 2023-04-02 DIAGNOSIS — M51369 Other intervertebral disc degeneration, lumbar region without mention of lumbar back pain or lower extremity pain: Secondary | ICD-10-CM | POA: Diagnosis not present

## 2023-04-02 DIAGNOSIS — Z86718 Personal history of other venous thrombosis and embolism: Secondary | ICD-10-CM | POA: Diagnosis not present

## 2023-04-02 DIAGNOSIS — Z4789 Encounter for other orthopedic aftercare: Secondary | ICD-10-CM | POA: Diagnosis not present

## 2023-04-02 DIAGNOSIS — I129 Hypertensive chronic kidney disease with stage 1 through stage 4 chronic kidney disease, or unspecified chronic kidney disease: Secondary | ICD-10-CM | POA: Diagnosis not present

## 2023-04-02 DIAGNOSIS — Z556 Problems related to health literacy: Secondary | ICD-10-CM | POA: Diagnosis not present

## 2023-04-02 DIAGNOSIS — G4700 Insomnia, unspecified: Secondary | ICD-10-CM | POA: Diagnosis not present

## 2023-04-02 DIAGNOSIS — M419 Scoliosis, unspecified: Secondary | ICD-10-CM | POA: Diagnosis not present

## 2023-04-02 DIAGNOSIS — M4807 Spinal stenosis, lumbosacral region: Secondary | ICD-10-CM | POA: Diagnosis not present

## 2023-04-03 DIAGNOSIS — Z981 Arthrodesis status: Secondary | ICD-10-CM | POA: Diagnosis not present

## 2023-04-14 DIAGNOSIS — G2581 Restless legs syndrome: Secondary | ICD-10-CM | POA: Diagnosis not present

## 2023-04-14 DIAGNOSIS — Z299 Encounter for prophylactic measures, unspecified: Secondary | ICD-10-CM | POA: Diagnosis not present

## 2023-04-14 DIAGNOSIS — M25572 Pain in left ankle and joints of left foot: Secondary | ICD-10-CM | POA: Diagnosis not present

## 2023-04-14 DIAGNOSIS — M7989 Other specified soft tissue disorders: Secondary | ICD-10-CM | POA: Diagnosis not present

## 2023-04-14 DIAGNOSIS — D692 Other nonthrombocytopenic purpura: Secondary | ICD-10-CM | POA: Diagnosis not present

## 2023-04-14 DIAGNOSIS — M85872 Other specified disorders of bone density and structure, left ankle and foot: Secondary | ICD-10-CM | POA: Diagnosis not present

## 2023-04-14 DIAGNOSIS — E1165 Type 2 diabetes mellitus with hyperglycemia: Secondary | ICD-10-CM | POA: Diagnosis not present

## 2023-04-18 DIAGNOSIS — Z85828 Personal history of other malignant neoplasm of skin: Secondary | ICD-10-CM | POA: Diagnosis not present

## 2023-04-18 DIAGNOSIS — Z556 Problems related to health literacy: Secondary | ICD-10-CM | POA: Diagnosis not present

## 2023-04-18 DIAGNOSIS — E039 Hypothyroidism, unspecified: Secondary | ICD-10-CM | POA: Diagnosis not present

## 2023-04-18 DIAGNOSIS — M419 Scoliosis, unspecified: Secondary | ICD-10-CM | POA: Diagnosis not present

## 2023-04-18 DIAGNOSIS — G959 Disease of spinal cord, unspecified: Secondary | ICD-10-CM | POA: Diagnosis not present

## 2023-04-18 DIAGNOSIS — E1122 Type 2 diabetes mellitus with diabetic chronic kidney disease: Secondary | ICD-10-CM | POA: Diagnosis not present

## 2023-04-18 DIAGNOSIS — M810 Age-related osteoporosis without current pathological fracture: Secondary | ICD-10-CM | POA: Diagnosis not present

## 2023-04-18 DIAGNOSIS — I129 Hypertensive chronic kidney disease with stage 1 through stage 4 chronic kidney disease, or unspecified chronic kidney disease: Secondary | ICD-10-CM | POA: Diagnosis not present

## 2023-04-18 DIAGNOSIS — Z86718 Personal history of other venous thrombosis and embolism: Secondary | ICD-10-CM | POA: Diagnosis not present

## 2023-04-18 DIAGNOSIS — N181 Chronic kidney disease, stage 1: Secondary | ICD-10-CM | POA: Diagnosis not present

## 2023-04-18 DIAGNOSIS — M4807 Spinal stenosis, lumbosacral region: Secondary | ICD-10-CM | POA: Diagnosis not present

## 2023-04-18 DIAGNOSIS — Z7984 Long term (current) use of oral hypoglycemic drugs: Secondary | ICD-10-CM | POA: Diagnosis not present

## 2023-04-18 DIAGNOSIS — M51369 Other intervertebral disc degeneration, lumbar region without mention of lumbar back pain or lower extremity pain: Secondary | ICD-10-CM | POA: Diagnosis not present

## 2023-04-18 DIAGNOSIS — G4700 Insomnia, unspecified: Secondary | ICD-10-CM | POA: Diagnosis not present

## 2023-04-18 DIAGNOSIS — Z981 Arthrodesis status: Secondary | ICD-10-CM | POA: Diagnosis not present

## 2023-04-18 DIAGNOSIS — Z4789 Encounter for other orthopedic aftercare: Secondary | ICD-10-CM | POA: Diagnosis not present

## 2023-04-18 DIAGNOSIS — Z79891 Long term (current) use of opiate analgesic: Secondary | ICD-10-CM | POA: Diagnosis not present

## 2023-04-21 DIAGNOSIS — Z981 Arthrodesis status: Secondary | ICD-10-CM | POA: Diagnosis not present

## 2023-04-21 DIAGNOSIS — E1122 Type 2 diabetes mellitus with diabetic chronic kidney disease: Secondary | ICD-10-CM | POA: Diagnosis not present

## 2023-04-21 DIAGNOSIS — M4807 Spinal stenosis, lumbosacral region: Secondary | ICD-10-CM | POA: Diagnosis not present

## 2023-04-21 DIAGNOSIS — Z86718 Personal history of other venous thrombosis and embolism: Secondary | ICD-10-CM | POA: Diagnosis not present

## 2023-04-21 DIAGNOSIS — M810 Age-related osteoporosis without current pathological fracture: Secondary | ICD-10-CM | POA: Diagnosis not present

## 2023-04-21 DIAGNOSIS — Z556 Problems related to health literacy: Secondary | ICD-10-CM | POA: Diagnosis not present

## 2023-04-21 DIAGNOSIS — E039 Hypothyroidism, unspecified: Secondary | ICD-10-CM | POA: Diagnosis not present

## 2023-04-21 DIAGNOSIS — M51369 Other intervertebral disc degeneration, lumbar region without mention of lumbar back pain or lower extremity pain: Secondary | ICD-10-CM | POA: Diagnosis not present

## 2023-04-21 DIAGNOSIS — Z85828 Personal history of other malignant neoplasm of skin: Secondary | ICD-10-CM | POA: Diagnosis not present

## 2023-04-21 DIAGNOSIS — G959 Disease of spinal cord, unspecified: Secondary | ICD-10-CM | POA: Diagnosis not present

## 2023-04-21 DIAGNOSIS — I129 Hypertensive chronic kidney disease with stage 1 through stage 4 chronic kidney disease, or unspecified chronic kidney disease: Secondary | ICD-10-CM | POA: Diagnosis not present

## 2023-04-21 DIAGNOSIS — G4700 Insomnia, unspecified: Secondary | ICD-10-CM | POA: Diagnosis not present

## 2023-04-21 DIAGNOSIS — M419 Scoliosis, unspecified: Secondary | ICD-10-CM | POA: Diagnosis not present

## 2023-04-21 DIAGNOSIS — Z7984 Long term (current) use of oral hypoglycemic drugs: Secondary | ICD-10-CM | POA: Diagnosis not present

## 2023-04-21 DIAGNOSIS — Z4789 Encounter for other orthopedic aftercare: Secondary | ICD-10-CM | POA: Diagnosis not present

## 2023-04-21 DIAGNOSIS — N181 Chronic kidney disease, stage 1: Secondary | ICD-10-CM | POA: Diagnosis not present

## 2023-04-21 DIAGNOSIS — Z79891 Long term (current) use of opiate analgesic: Secondary | ICD-10-CM | POA: Diagnosis not present

## 2023-05-04 DIAGNOSIS — Z981 Arthrodesis status: Secondary | ICD-10-CM | POA: Diagnosis not present

## 2023-05-06 DIAGNOSIS — Z981 Arthrodesis status: Secondary | ICD-10-CM | POA: Diagnosis not present

## 2023-05-06 DIAGNOSIS — Z4789 Encounter for other orthopedic aftercare: Secondary | ICD-10-CM | POA: Diagnosis not present

## 2023-05-06 DIAGNOSIS — E039 Hypothyroidism, unspecified: Secondary | ICD-10-CM | POA: Diagnosis not present

## 2023-05-06 DIAGNOSIS — I872 Venous insufficiency (chronic) (peripheral): Secondary | ICD-10-CM | POA: Diagnosis not present

## 2023-05-06 DIAGNOSIS — Z993 Dependence on wheelchair: Secondary | ICD-10-CM | POA: Diagnosis not present

## 2023-05-06 DIAGNOSIS — M47817 Spondylosis without myelopathy or radiculopathy, lumbosacral region: Secondary | ICD-10-CM | POA: Diagnosis not present

## 2023-05-06 DIAGNOSIS — Z853 Personal history of malignant neoplasm of breast: Secondary | ICD-10-CM | POA: Diagnosis not present

## 2023-05-06 DIAGNOSIS — I129 Hypertensive chronic kidney disease with stage 1 through stage 4 chronic kidney disease, or unspecified chronic kidney disease: Secondary | ICD-10-CM | POA: Diagnosis not present

## 2023-05-06 DIAGNOSIS — M1990 Unspecified osteoarthritis, unspecified site: Secondary | ICD-10-CM | POA: Diagnosis not present

## 2023-05-06 DIAGNOSIS — Z7984 Long term (current) use of oral hypoglycemic drugs: Secondary | ICD-10-CM | POA: Diagnosis not present

## 2023-05-06 DIAGNOSIS — Z79891 Long term (current) use of opiate analgesic: Secondary | ICD-10-CM | POA: Diagnosis not present

## 2023-05-06 DIAGNOSIS — M5412 Radiculopathy, cervical region: Secondary | ICD-10-CM | POA: Diagnosis not present

## 2023-05-06 DIAGNOSIS — Z9181 History of falling: Secondary | ICD-10-CM | POA: Diagnosis not present

## 2023-05-06 DIAGNOSIS — G4700 Insomnia, unspecified: Secondary | ICD-10-CM | POA: Diagnosis not present

## 2023-05-06 DIAGNOSIS — Z556 Problems related to health literacy: Secondary | ICD-10-CM | POA: Diagnosis not present

## 2023-05-06 DIAGNOSIS — Z86718 Personal history of other venous thrombosis and embolism: Secondary | ICD-10-CM | POA: Diagnosis not present

## 2023-05-06 DIAGNOSIS — M5000 Cervical disc disorder with myelopathy, unspecified cervical region: Secondary | ICD-10-CM | POA: Diagnosis not present

## 2023-05-06 DIAGNOSIS — S92352D Displaced fracture of fifth metatarsal bone, left foot, subsequent encounter for fracture with routine healing: Secondary | ICD-10-CM | POA: Diagnosis not present

## 2023-05-06 DIAGNOSIS — N181 Chronic kidney disease, stage 1: Secondary | ICD-10-CM | POA: Diagnosis not present

## 2023-05-06 DIAGNOSIS — Z87440 Personal history of urinary (tract) infections: Secondary | ICD-10-CM | POA: Diagnosis not present

## 2023-05-06 DIAGNOSIS — E1122 Type 2 diabetes mellitus with diabetic chronic kidney disease: Secondary | ICD-10-CM | POA: Diagnosis not present

## 2023-05-06 DIAGNOSIS — G8929 Other chronic pain: Secondary | ICD-10-CM | POA: Diagnosis not present

## 2023-05-06 DIAGNOSIS — M4802 Spinal stenosis, cervical region: Secondary | ICD-10-CM | POA: Diagnosis not present

## 2023-05-16 DIAGNOSIS — I872 Venous insufficiency (chronic) (peripheral): Secondary | ICD-10-CM | POA: Diagnosis not present

## 2023-05-16 DIAGNOSIS — G4700 Insomnia, unspecified: Secondary | ICD-10-CM | POA: Diagnosis not present

## 2023-05-16 DIAGNOSIS — Z853 Personal history of malignant neoplasm of breast: Secondary | ICD-10-CM | POA: Diagnosis not present

## 2023-05-16 DIAGNOSIS — S92352D Displaced fracture of fifth metatarsal bone, left foot, subsequent encounter for fracture with routine healing: Secondary | ICD-10-CM | POA: Diagnosis not present

## 2023-05-16 DIAGNOSIS — G8929 Other chronic pain: Secondary | ICD-10-CM | POA: Diagnosis not present

## 2023-05-16 DIAGNOSIS — M1990 Unspecified osteoarthritis, unspecified site: Secondary | ICD-10-CM | POA: Diagnosis not present

## 2023-05-16 DIAGNOSIS — N181 Chronic kidney disease, stage 1: Secondary | ICD-10-CM | POA: Diagnosis not present

## 2023-05-16 DIAGNOSIS — Z4789 Encounter for other orthopedic aftercare: Secondary | ICD-10-CM | POA: Diagnosis not present

## 2023-05-16 DIAGNOSIS — Z79891 Long term (current) use of opiate analgesic: Secondary | ICD-10-CM | POA: Diagnosis not present

## 2023-05-16 DIAGNOSIS — Z981 Arthrodesis status: Secondary | ICD-10-CM | POA: Diagnosis not present

## 2023-05-16 DIAGNOSIS — Z556 Problems related to health literacy: Secondary | ICD-10-CM | POA: Diagnosis not present

## 2023-05-16 DIAGNOSIS — Z9181 History of falling: Secondary | ICD-10-CM | POA: Diagnosis not present

## 2023-05-16 DIAGNOSIS — E1122 Type 2 diabetes mellitus with diabetic chronic kidney disease: Secondary | ICD-10-CM | POA: Diagnosis not present

## 2023-05-16 DIAGNOSIS — M5000 Cervical disc disorder with myelopathy, unspecified cervical region: Secondary | ICD-10-CM | POA: Diagnosis not present

## 2023-05-16 DIAGNOSIS — Z86718 Personal history of other venous thrombosis and embolism: Secondary | ICD-10-CM | POA: Diagnosis not present

## 2023-05-16 DIAGNOSIS — Z7984 Long term (current) use of oral hypoglycemic drugs: Secondary | ICD-10-CM | POA: Diagnosis not present

## 2023-05-16 DIAGNOSIS — M47817 Spondylosis without myelopathy or radiculopathy, lumbosacral region: Secondary | ICD-10-CM | POA: Diagnosis not present

## 2023-05-16 DIAGNOSIS — E039 Hypothyroidism, unspecified: Secondary | ICD-10-CM | POA: Diagnosis not present

## 2023-05-16 DIAGNOSIS — M5412 Radiculopathy, cervical region: Secondary | ICD-10-CM | POA: Diagnosis not present

## 2023-05-16 DIAGNOSIS — I129 Hypertensive chronic kidney disease with stage 1 through stage 4 chronic kidney disease, or unspecified chronic kidney disease: Secondary | ICD-10-CM | POA: Diagnosis not present

## 2023-05-16 DIAGNOSIS — Z87440 Personal history of urinary (tract) infections: Secondary | ICD-10-CM | POA: Diagnosis not present

## 2023-05-16 DIAGNOSIS — Z993 Dependence on wheelchair: Secondary | ICD-10-CM | POA: Diagnosis not present

## 2023-05-16 DIAGNOSIS — M4802 Spinal stenosis, cervical region: Secondary | ICD-10-CM | POA: Diagnosis not present

## 2023-05-19 DIAGNOSIS — E1122 Type 2 diabetes mellitus with diabetic chronic kidney disease: Secondary | ICD-10-CM | POA: Diagnosis not present

## 2023-05-19 DIAGNOSIS — Z993 Dependence on wheelchair: Secondary | ICD-10-CM | POA: Diagnosis not present

## 2023-05-19 DIAGNOSIS — M4802 Spinal stenosis, cervical region: Secondary | ICD-10-CM | POA: Diagnosis not present

## 2023-05-19 DIAGNOSIS — I872 Venous insufficiency (chronic) (peripheral): Secondary | ICD-10-CM | POA: Diagnosis not present

## 2023-05-19 DIAGNOSIS — M47817 Spondylosis without myelopathy or radiculopathy, lumbosacral region: Secondary | ICD-10-CM | POA: Diagnosis not present

## 2023-05-19 DIAGNOSIS — N181 Chronic kidney disease, stage 1: Secondary | ICD-10-CM | POA: Diagnosis not present

## 2023-05-19 DIAGNOSIS — Z9181 History of falling: Secondary | ICD-10-CM | POA: Diagnosis not present

## 2023-05-19 DIAGNOSIS — M5000 Cervical disc disorder with myelopathy, unspecified cervical region: Secondary | ICD-10-CM | POA: Diagnosis not present

## 2023-05-19 DIAGNOSIS — Z853 Personal history of malignant neoplasm of breast: Secondary | ICD-10-CM | POA: Diagnosis not present

## 2023-05-19 DIAGNOSIS — Z7984 Long term (current) use of oral hypoglycemic drugs: Secondary | ICD-10-CM | POA: Diagnosis not present

## 2023-05-19 DIAGNOSIS — Z556 Problems related to health literacy: Secondary | ICD-10-CM | POA: Diagnosis not present

## 2023-05-19 DIAGNOSIS — Z87440 Personal history of urinary (tract) infections: Secondary | ICD-10-CM | POA: Diagnosis not present

## 2023-05-19 DIAGNOSIS — E039 Hypothyroidism, unspecified: Secondary | ICD-10-CM | POA: Diagnosis not present

## 2023-05-19 DIAGNOSIS — M1990 Unspecified osteoarthritis, unspecified site: Secondary | ICD-10-CM | POA: Diagnosis not present

## 2023-05-19 DIAGNOSIS — Z79891 Long term (current) use of opiate analgesic: Secondary | ICD-10-CM | POA: Diagnosis not present

## 2023-05-19 DIAGNOSIS — G4700 Insomnia, unspecified: Secondary | ICD-10-CM | POA: Diagnosis not present

## 2023-05-19 DIAGNOSIS — Z4789 Encounter for other orthopedic aftercare: Secondary | ICD-10-CM | POA: Diagnosis not present

## 2023-05-19 DIAGNOSIS — I129 Hypertensive chronic kidney disease with stage 1 through stage 4 chronic kidney disease, or unspecified chronic kidney disease: Secondary | ICD-10-CM | POA: Diagnosis not present

## 2023-05-19 DIAGNOSIS — S92352D Displaced fracture of fifth metatarsal bone, left foot, subsequent encounter for fracture with routine healing: Secondary | ICD-10-CM | POA: Diagnosis not present

## 2023-05-19 DIAGNOSIS — Z981 Arthrodesis status: Secondary | ICD-10-CM | POA: Diagnosis not present

## 2023-05-19 DIAGNOSIS — G8929 Other chronic pain: Secondary | ICD-10-CM | POA: Diagnosis not present

## 2023-05-19 DIAGNOSIS — M5412 Radiculopathy, cervical region: Secondary | ICD-10-CM | POA: Diagnosis not present

## 2023-05-19 DIAGNOSIS — Z86718 Personal history of other venous thrombosis and embolism: Secondary | ICD-10-CM | POA: Diagnosis not present

## 2023-05-21 DIAGNOSIS — M542 Cervicalgia: Secondary | ICD-10-CM | POA: Diagnosis not present

## 2023-05-21 DIAGNOSIS — G894 Chronic pain syndrome: Secondary | ICD-10-CM | POA: Diagnosis not present

## 2023-05-21 DIAGNOSIS — M5412 Radiculopathy, cervical region: Secondary | ICD-10-CM | POA: Diagnosis not present

## 2023-05-21 DIAGNOSIS — G959 Disease of spinal cord, unspecified: Secondary | ICD-10-CM | POA: Diagnosis not present

## 2023-05-22 DIAGNOSIS — Z4789 Encounter for other orthopedic aftercare: Secondary | ICD-10-CM | POA: Diagnosis not present

## 2023-05-22 DIAGNOSIS — I872 Venous insufficiency (chronic) (peripheral): Secondary | ICD-10-CM | POA: Diagnosis not present

## 2023-05-22 DIAGNOSIS — Z556 Problems related to health literacy: Secondary | ICD-10-CM | POA: Diagnosis not present

## 2023-05-22 DIAGNOSIS — S92352D Displaced fracture of fifth metatarsal bone, left foot, subsequent encounter for fracture with routine healing: Secondary | ICD-10-CM | POA: Diagnosis not present

## 2023-05-22 DIAGNOSIS — G4700 Insomnia, unspecified: Secondary | ICD-10-CM | POA: Diagnosis not present

## 2023-05-22 DIAGNOSIS — N181 Chronic kidney disease, stage 1: Secondary | ICD-10-CM | POA: Diagnosis not present

## 2023-05-22 DIAGNOSIS — Z7984 Long term (current) use of oral hypoglycemic drugs: Secondary | ICD-10-CM | POA: Diagnosis not present

## 2023-05-22 DIAGNOSIS — G8929 Other chronic pain: Secondary | ICD-10-CM | POA: Diagnosis not present

## 2023-05-22 DIAGNOSIS — E039 Hypothyroidism, unspecified: Secondary | ICD-10-CM | POA: Diagnosis not present

## 2023-05-22 DIAGNOSIS — M47817 Spondylosis without myelopathy or radiculopathy, lumbosacral region: Secondary | ICD-10-CM | POA: Diagnosis not present

## 2023-05-22 DIAGNOSIS — Z853 Personal history of malignant neoplasm of breast: Secondary | ICD-10-CM | POA: Diagnosis not present

## 2023-05-22 DIAGNOSIS — Z993 Dependence on wheelchair: Secondary | ICD-10-CM | POA: Diagnosis not present

## 2023-05-22 DIAGNOSIS — M5412 Radiculopathy, cervical region: Secondary | ICD-10-CM | POA: Diagnosis not present

## 2023-05-22 DIAGNOSIS — I129 Hypertensive chronic kidney disease with stage 1 through stage 4 chronic kidney disease, or unspecified chronic kidney disease: Secondary | ICD-10-CM | POA: Diagnosis not present

## 2023-05-22 DIAGNOSIS — M5000 Cervical disc disorder with myelopathy, unspecified cervical region: Secondary | ICD-10-CM | POA: Diagnosis not present

## 2023-05-22 DIAGNOSIS — Z87440 Personal history of urinary (tract) infections: Secondary | ICD-10-CM | POA: Diagnosis not present

## 2023-05-22 DIAGNOSIS — Z79891 Long term (current) use of opiate analgesic: Secondary | ICD-10-CM | POA: Diagnosis not present

## 2023-05-22 DIAGNOSIS — E1122 Type 2 diabetes mellitus with diabetic chronic kidney disease: Secondary | ICD-10-CM | POA: Diagnosis not present

## 2023-05-22 DIAGNOSIS — Z9181 History of falling: Secondary | ICD-10-CM | POA: Diagnosis not present

## 2023-05-22 DIAGNOSIS — Z86718 Personal history of other venous thrombosis and embolism: Secondary | ICD-10-CM | POA: Diagnosis not present

## 2023-05-22 DIAGNOSIS — M4802 Spinal stenosis, cervical region: Secondary | ICD-10-CM | POA: Diagnosis not present

## 2023-05-22 DIAGNOSIS — M1990 Unspecified osteoarthritis, unspecified site: Secondary | ICD-10-CM | POA: Diagnosis not present

## 2023-05-22 DIAGNOSIS — Z981 Arthrodesis status: Secondary | ICD-10-CM | POA: Diagnosis not present

## 2023-05-28 DIAGNOSIS — N181 Chronic kidney disease, stage 1: Secondary | ICD-10-CM | POA: Diagnosis not present

## 2023-05-28 DIAGNOSIS — G8929 Other chronic pain: Secondary | ICD-10-CM | POA: Diagnosis not present

## 2023-05-28 DIAGNOSIS — E1122 Type 2 diabetes mellitus with diabetic chronic kidney disease: Secondary | ICD-10-CM | POA: Diagnosis not present

## 2023-05-28 DIAGNOSIS — I129 Hypertensive chronic kidney disease with stage 1 through stage 4 chronic kidney disease, or unspecified chronic kidney disease: Secondary | ICD-10-CM | POA: Diagnosis not present

## 2023-06-03 DIAGNOSIS — Z79891 Long term (current) use of opiate analgesic: Secondary | ICD-10-CM | POA: Diagnosis not present

## 2023-06-03 DIAGNOSIS — Z4789 Encounter for other orthopedic aftercare: Secondary | ICD-10-CM | POA: Diagnosis not present

## 2023-06-03 DIAGNOSIS — N181 Chronic kidney disease, stage 1: Secondary | ICD-10-CM | POA: Diagnosis not present

## 2023-06-03 DIAGNOSIS — I129 Hypertensive chronic kidney disease with stage 1 through stage 4 chronic kidney disease, or unspecified chronic kidney disease: Secondary | ICD-10-CM | POA: Diagnosis not present

## 2023-06-03 DIAGNOSIS — Z993 Dependence on wheelchair: Secondary | ICD-10-CM | POA: Diagnosis not present

## 2023-06-03 DIAGNOSIS — Z86718 Personal history of other venous thrombosis and embolism: Secondary | ICD-10-CM | POA: Diagnosis not present

## 2023-06-03 DIAGNOSIS — S92352D Displaced fracture of fifth metatarsal bone, left foot, subsequent encounter for fracture with routine healing: Secondary | ICD-10-CM | POA: Diagnosis not present

## 2023-06-03 DIAGNOSIS — M5000 Cervical disc disorder with myelopathy, unspecified cervical region: Secondary | ICD-10-CM | POA: Diagnosis not present

## 2023-06-03 DIAGNOSIS — Z9181 History of falling: Secondary | ICD-10-CM | POA: Diagnosis not present

## 2023-06-03 DIAGNOSIS — M5412 Radiculopathy, cervical region: Secondary | ICD-10-CM | POA: Diagnosis not present

## 2023-06-03 DIAGNOSIS — Z556 Problems related to health literacy: Secondary | ICD-10-CM | POA: Diagnosis not present

## 2023-06-03 DIAGNOSIS — Z981 Arthrodesis status: Secondary | ICD-10-CM | POA: Diagnosis not present

## 2023-06-03 DIAGNOSIS — E039 Hypothyroidism, unspecified: Secondary | ICD-10-CM | POA: Diagnosis not present

## 2023-06-03 DIAGNOSIS — Z7984 Long term (current) use of oral hypoglycemic drugs: Secondary | ICD-10-CM | POA: Diagnosis not present

## 2023-06-03 DIAGNOSIS — Z87440 Personal history of urinary (tract) infections: Secondary | ICD-10-CM | POA: Diagnosis not present

## 2023-06-03 DIAGNOSIS — M47817 Spondylosis without myelopathy or radiculopathy, lumbosacral region: Secondary | ICD-10-CM | POA: Diagnosis not present

## 2023-06-03 DIAGNOSIS — I872 Venous insufficiency (chronic) (peripheral): Secondary | ICD-10-CM | POA: Diagnosis not present

## 2023-06-03 DIAGNOSIS — G4700 Insomnia, unspecified: Secondary | ICD-10-CM | POA: Diagnosis not present

## 2023-06-03 DIAGNOSIS — E1122 Type 2 diabetes mellitus with diabetic chronic kidney disease: Secondary | ICD-10-CM | POA: Diagnosis not present

## 2023-06-03 DIAGNOSIS — Z853 Personal history of malignant neoplasm of breast: Secondary | ICD-10-CM | POA: Diagnosis not present

## 2023-06-03 DIAGNOSIS — G8929 Other chronic pain: Secondary | ICD-10-CM | POA: Diagnosis not present

## 2023-06-03 DIAGNOSIS — M1990 Unspecified osteoarthritis, unspecified site: Secondary | ICD-10-CM | POA: Diagnosis not present

## 2023-06-03 DIAGNOSIS — M4802 Spinal stenosis, cervical region: Secondary | ICD-10-CM | POA: Diagnosis not present

## 2023-06-06 DIAGNOSIS — Z9181 History of falling: Secondary | ICD-10-CM | POA: Diagnosis not present

## 2023-06-06 DIAGNOSIS — Z556 Problems related to health literacy: Secondary | ICD-10-CM | POA: Diagnosis not present

## 2023-06-06 DIAGNOSIS — Z87440 Personal history of urinary (tract) infections: Secondary | ICD-10-CM | POA: Diagnosis not present

## 2023-06-06 DIAGNOSIS — I129 Hypertensive chronic kidney disease with stage 1 through stage 4 chronic kidney disease, or unspecified chronic kidney disease: Secondary | ICD-10-CM | POA: Diagnosis not present

## 2023-06-06 DIAGNOSIS — Z7984 Long term (current) use of oral hypoglycemic drugs: Secondary | ICD-10-CM | POA: Diagnosis not present

## 2023-06-06 DIAGNOSIS — M1990 Unspecified osteoarthritis, unspecified site: Secondary | ICD-10-CM | POA: Diagnosis not present

## 2023-06-06 DIAGNOSIS — M5000 Cervical disc disorder with myelopathy, unspecified cervical region: Secondary | ICD-10-CM | POA: Diagnosis not present

## 2023-06-06 DIAGNOSIS — Z993 Dependence on wheelchair: Secondary | ICD-10-CM | POA: Diagnosis not present

## 2023-06-06 DIAGNOSIS — M47817 Spondylosis without myelopathy or radiculopathy, lumbosacral region: Secondary | ICD-10-CM | POA: Diagnosis not present

## 2023-06-06 DIAGNOSIS — Z86718 Personal history of other venous thrombosis and embolism: Secondary | ICD-10-CM | POA: Diagnosis not present

## 2023-06-06 DIAGNOSIS — S92352D Displaced fracture of fifth metatarsal bone, left foot, subsequent encounter for fracture with routine healing: Secondary | ICD-10-CM | POA: Diagnosis not present

## 2023-06-06 DIAGNOSIS — G8929 Other chronic pain: Secondary | ICD-10-CM | POA: Diagnosis not present

## 2023-06-06 DIAGNOSIS — Z981 Arthrodesis status: Secondary | ICD-10-CM | POA: Diagnosis not present

## 2023-06-06 DIAGNOSIS — M4802 Spinal stenosis, cervical region: Secondary | ICD-10-CM | POA: Diagnosis not present

## 2023-06-06 DIAGNOSIS — N181 Chronic kidney disease, stage 1: Secondary | ICD-10-CM | POA: Diagnosis not present

## 2023-06-06 DIAGNOSIS — E1122 Type 2 diabetes mellitus with diabetic chronic kidney disease: Secondary | ICD-10-CM | POA: Diagnosis not present

## 2023-06-06 DIAGNOSIS — E039 Hypothyroidism, unspecified: Secondary | ICD-10-CM | POA: Diagnosis not present

## 2023-06-06 DIAGNOSIS — G4700 Insomnia, unspecified: Secondary | ICD-10-CM | POA: Diagnosis not present

## 2023-06-06 DIAGNOSIS — Z4789 Encounter for other orthopedic aftercare: Secondary | ICD-10-CM | POA: Diagnosis not present

## 2023-06-06 DIAGNOSIS — Z79891 Long term (current) use of opiate analgesic: Secondary | ICD-10-CM | POA: Diagnosis not present

## 2023-06-06 DIAGNOSIS — Z853 Personal history of malignant neoplasm of breast: Secondary | ICD-10-CM | POA: Diagnosis not present

## 2023-06-06 DIAGNOSIS — M5412 Radiculopathy, cervical region: Secondary | ICD-10-CM | POA: Diagnosis not present

## 2023-06-06 DIAGNOSIS — I872 Venous insufficiency (chronic) (peripheral): Secondary | ICD-10-CM | POA: Diagnosis not present

## 2023-06-09 DIAGNOSIS — Z86718 Personal history of other venous thrombosis and embolism: Secondary | ICD-10-CM | POA: Diagnosis not present

## 2023-06-09 DIAGNOSIS — Z993 Dependence on wheelchair: Secondary | ICD-10-CM | POA: Diagnosis not present

## 2023-06-09 DIAGNOSIS — Z7984 Long term (current) use of oral hypoglycemic drugs: Secondary | ICD-10-CM | POA: Diagnosis not present

## 2023-06-09 DIAGNOSIS — M47817 Spondylosis without myelopathy or radiculopathy, lumbosacral region: Secondary | ICD-10-CM | POA: Diagnosis not present

## 2023-06-09 DIAGNOSIS — S92352D Displaced fracture of fifth metatarsal bone, left foot, subsequent encounter for fracture with routine healing: Secondary | ICD-10-CM | POA: Diagnosis not present

## 2023-06-09 DIAGNOSIS — Z981 Arthrodesis status: Secondary | ICD-10-CM | POA: Diagnosis not present

## 2023-06-09 DIAGNOSIS — Z556 Problems related to health literacy: Secondary | ICD-10-CM | POA: Diagnosis not present

## 2023-06-09 DIAGNOSIS — E039 Hypothyroidism, unspecified: Secondary | ICD-10-CM | POA: Diagnosis not present

## 2023-06-09 DIAGNOSIS — M1990 Unspecified osteoarthritis, unspecified site: Secondary | ICD-10-CM | POA: Diagnosis not present

## 2023-06-09 DIAGNOSIS — Z87440 Personal history of urinary (tract) infections: Secondary | ICD-10-CM | POA: Diagnosis not present

## 2023-06-09 DIAGNOSIS — E1122 Type 2 diabetes mellitus with diabetic chronic kidney disease: Secondary | ICD-10-CM | POA: Diagnosis not present

## 2023-06-09 DIAGNOSIS — G4700 Insomnia, unspecified: Secondary | ICD-10-CM | POA: Diagnosis not present

## 2023-06-09 DIAGNOSIS — N181 Chronic kidney disease, stage 1: Secondary | ICD-10-CM | POA: Diagnosis not present

## 2023-06-09 DIAGNOSIS — I872 Venous insufficiency (chronic) (peripheral): Secondary | ICD-10-CM | POA: Diagnosis not present

## 2023-06-09 DIAGNOSIS — I129 Hypertensive chronic kidney disease with stage 1 through stage 4 chronic kidney disease, or unspecified chronic kidney disease: Secondary | ICD-10-CM | POA: Diagnosis not present

## 2023-06-09 DIAGNOSIS — Z4789 Encounter for other orthopedic aftercare: Secondary | ICD-10-CM | POA: Diagnosis not present

## 2023-06-09 DIAGNOSIS — G8929 Other chronic pain: Secondary | ICD-10-CM | POA: Diagnosis not present

## 2023-06-09 DIAGNOSIS — Z9181 History of falling: Secondary | ICD-10-CM | POA: Diagnosis not present

## 2023-06-09 DIAGNOSIS — M4802 Spinal stenosis, cervical region: Secondary | ICD-10-CM | POA: Diagnosis not present

## 2023-06-09 DIAGNOSIS — Z853 Personal history of malignant neoplasm of breast: Secondary | ICD-10-CM | POA: Diagnosis not present

## 2023-06-09 DIAGNOSIS — M5000 Cervical disc disorder with myelopathy, unspecified cervical region: Secondary | ICD-10-CM | POA: Diagnosis not present

## 2023-06-09 DIAGNOSIS — Z79891 Long term (current) use of opiate analgesic: Secondary | ICD-10-CM | POA: Diagnosis not present

## 2023-06-09 DIAGNOSIS — M5412 Radiculopathy, cervical region: Secondary | ICD-10-CM | POA: Diagnosis not present

## 2023-06-18 DIAGNOSIS — Z79891 Long term (current) use of opiate analgesic: Secondary | ICD-10-CM | POA: Diagnosis not present

## 2023-06-18 DIAGNOSIS — E039 Hypothyroidism, unspecified: Secondary | ICD-10-CM | POA: Diagnosis not present

## 2023-06-18 DIAGNOSIS — G4700 Insomnia, unspecified: Secondary | ICD-10-CM | POA: Diagnosis not present

## 2023-06-18 DIAGNOSIS — Z981 Arthrodesis status: Secondary | ICD-10-CM | POA: Diagnosis not present

## 2023-06-18 DIAGNOSIS — Z853 Personal history of malignant neoplasm of breast: Secondary | ICD-10-CM | POA: Diagnosis not present

## 2023-06-18 DIAGNOSIS — I129 Hypertensive chronic kidney disease with stage 1 through stage 4 chronic kidney disease, or unspecified chronic kidney disease: Secondary | ICD-10-CM | POA: Diagnosis not present

## 2023-06-18 DIAGNOSIS — G8929 Other chronic pain: Secondary | ICD-10-CM | POA: Diagnosis not present

## 2023-06-18 DIAGNOSIS — Z993 Dependence on wheelchair: Secondary | ICD-10-CM | POA: Diagnosis not present

## 2023-06-18 DIAGNOSIS — E1122 Type 2 diabetes mellitus with diabetic chronic kidney disease: Secondary | ICD-10-CM | POA: Diagnosis not present

## 2023-06-18 DIAGNOSIS — S92352D Displaced fracture of fifth metatarsal bone, left foot, subsequent encounter for fracture with routine healing: Secondary | ICD-10-CM | POA: Diagnosis not present

## 2023-06-18 DIAGNOSIS — N181 Chronic kidney disease, stage 1: Secondary | ICD-10-CM | POA: Diagnosis not present

## 2023-06-18 DIAGNOSIS — Z9181 History of falling: Secondary | ICD-10-CM | POA: Diagnosis not present

## 2023-06-18 DIAGNOSIS — M4802 Spinal stenosis, cervical region: Secondary | ICD-10-CM | POA: Diagnosis not present

## 2023-06-18 DIAGNOSIS — I872 Venous insufficiency (chronic) (peripheral): Secondary | ICD-10-CM | POA: Diagnosis not present

## 2023-06-18 DIAGNOSIS — Z7984 Long term (current) use of oral hypoglycemic drugs: Secondary | ICD-10-CM | POA: Diagnosis not present

## 2023-06-18 DIAGNOSIS — Z556 Problems related to health literacy: Secondary | ICD-10-CM | POA: Diagnosis not present

## 2023-06-18 DIAGNOSIS — Z86718 Personal history of other venous thrombosis and embolism: Secondary | ICD-10-CM | POA: Diagnosis not present

## 2023-06-18 DIAGNOSIS — Z87440 Personal history of urinary (tract) infections: Secondary | ICD-10-CM | POA: Diagnosis not present

## 2023-06-18 DIAGNOSIS — Z4789 Encounter for other orthopedic aftercare: Secondary | ICD-10-CM | POA: Diagnosis not present

## 2023-06-18 DIAGNOSIS — M5412 Radiculopathy, cervical region: Secondary | ICD-10-CM | POA: Diagnosis not present

## 2023-06-18 DIAGNOSIS — M47817 Spondylosis without myelopathy or radiculopathy, lumbosacral region: Secondary | ICD-10-CM | POA: Diagnosis not present

## 2023-06-18 DIAGNOSIS — M5000 Cervical disc disorder with myelopathy, unspecified cervical region: Secondary | ICD-10-CM | POA: Diagnosis not present

## 2023-06-18 DIAGNOSIS — M1990 Unspecified osteoarthritis, unspecified site: Secondary | ICD-10-CM | POA: Diagnosis not present

## 2023-06-20 DIAGNOSIS — Z7984 Long term (current) use of oral hypoglycemic drugs: Secondary | ICD-10-CM | POA: Diagnosis not present

## 2023-06-20 DIAGNOSIS — M1990 Unspecified osteoarthritis, unspecified site: Secondary | ICD-10-CM | POA: Diagnosis not present

## 2023-06-20 DIAGNOSIS — Z993 Dependence on wheelchair: Secondary | ICD-10-CM | POA: Diagnosis not present

## 2023-06-20 DIAGNOSIS — M4802 Spinal stenosis, cervical region: Secondary | ICD-10-CM | POA: Diagnosis not present

## 2023-06-20 DIAGNOSIS — I129 Hypertensive chronic kidney disease with stage 1 through stage 4 chronic kidney disease, or unspecified chronic kidney disease: Secondary | ICD-10-CM | POA: Diagnosis not present

## 2023-06-20 DIAGNOSIS — E039 Hypothyroidism, unspecified: Secondary | ICD-10-CM | POA: Diagnosis not present

## 2023-06-20 DIAGNOSIS — S92352D Displaced fracture of fifth metatarsal bone, left foot, subsequent encounter for fracture with routine healing: Secondary | ICD-10-CM | POA: Diagnosis not present

## 2023-06-20 DIAGNOSIS — Z981 Arthrodesis status: Secondary | ICD-10-CM | POA: Diagnosis not present

## 2023-06-20 DIAGNOSIS — M47817 Spondylosis without myelopathy or radiculopathy, lumbosacral region: Secondary | ICD-10-CM | POA: Diagnosis not present

## 2023-06-20 DIAGNOSIS — Z87440 Personal history of urinary (tract) infections: Secondary | ICD-10-CM | POA: Diagnosis not present

## 2023-06-20 DIAGNOSIS — M5000 Cervical disc disorder with myelopathy, unspecified cervical region: Secondary | ICD-10-CM | POA: Diagnosis not present

## 2023-06-20 DIAGNOSIS — E1122 Type 2 diabetes mellitus with diabetic chronic kidney disease: Secondary | ICD-10-CM | POA: Diagnosis not present

## 2023-06-20 DIAGNOSIS — Z853 Personal history of malignant neoplasm of breast: Secondary | ICD-10-CM | POA: Diagnosis not present

## 2023-06-20 DIAGNOSIS — M5412 Radiculopathy, cervical region: Secondary | ICD-10-CM | POA: Diagnosis not present

## 2023-06-20 DIAGNOSIS — Z4789 Encounter for other orthopedic aftercare: Secondary | ICD-10-CM | POA: Diagnosis not present

## 2023-06-20 DIAGNOSIS — G4700 Insomnia, unspecified: Secondary | ICD-10-CM | POA: Diagnosis not present

## 2023-06-20 DIAGNOSIS — Z79891 Long term (current) use of opiate analgesic: Secondary | ICD-10-CM | POA: Diagnosis not present

## 2023-06-20 DIAGNOSIS — N181 Chronic kidney disease, stage 1: Secondary | ICD-10-CM | POA: Diagnosis not present

## 2023-06-20 DIAGNOSIS — G8929 Other chronic pain: Secondary | ICD-10-CM | POA: Diagnosis not present

## 2023-06-20 DIAGNOSIS — Z9181 History of falling: Secondary | ICD-10-CM | POA: Diagnosis not present

## 2023-06-20 DIAGNOSIS — Z556 Problems related to health literacy: Secondary | ICD-10-CM | POA: Diagnosis not present

## 2023-06-20 DIAGNOSIS — I872 Venous insufficiency (chronic) (peripheral): Secondary | ICD-10-CM | POA: Diagnosis not present

## 2023-06-20 DIAGNOSIS — Z86718 Personal history of other venous thrombosis and embolism: Secondary | ICD-10-CM | POA: Diagnosis not present

## 2023-06-23 DIAGNOSIS — Z7984 Long term (current) use of oral hypoglycemic drugs: Secondary | ICD-10-CM | POA: Diagnosis not present

## 2023-06-23 DIAGNOSIS — M47817 Spondylosis without myelopathy or radiculopathy, lumbosacral region: Secondary | ICD-10-CM | POA: Diagnosis not present

## 2023-06-23 DIAGNOSIS — Z9181 History of falling: Secondary | ICD-10-CM | POA: Diagnosis not present

## 2023-06-23 DIAGNOSIS — S92352D Displaced fracture of fifth metatarsal bone, left foot, subsequent encounter for fracture with routine healing: Secondary | ICD-10-CM | POA: Diagnosis not present

## 2023-06-23 DIAGNOSIS — M1990 Unspecified osteoarthritis, unspecified site: Secondary | ICD-10-CM | POA: Diagnosis not present

## 2023-06-23 DIAGNOSIS — Z556 Problems related to health literacy: Secondary | ICD-10-CM | POA: Diagnosis not present

## 2023-06-23 DIAGNOSIS — Z853 Personal history of malignant neoplasm of breast: Secondary | ICD-10-CM | POA: Diagnosis not present

## 2023-06-23 DIAGNOSIS — I129 Hypertensive chronic kidney disease with stage 1 through stage 4 chronic kidney disease, or unspecified chronic kidney disease: Secondary | ICD-10-CM | POA: Diagnosis not present

## 2023-06-23 DIAGNOSIS — N181 Chronic kidney disease, stage 1: Secondary | ICD-10-CM | POA: Diagnosis not present

## 2023-06-23 DIAGNOSIS — Z981 Arthrodesis status: Secondary | ICD-10-CM | POA: Diagnosis not present

## 2023-06-23 DIAGNOSIS — I872 Venous insufficiency (chronic) (peripheral): Secondary | ICD-10-CM | POA: Diagnosis not present

## 2023-06-23 DIAGNOSIS — Z87440 Personal history of urinary (tract) infections: Secondary | ICD-10-CM | POA: Diagnosis not present

## 2023-06-23 DIAGNOSIS — M5412 Radiculopathy, cervical region: Secondary | ICD-10-CM | POA: Diagnosis not present

## 2023-06-23 DIAGNOSIS — Z4789 Encounter for other orthopedic aftercare: Secondary | ICD-10-CM | POA: Diagnosis not present

## 2023-06-23 DIAGNOSIS — E1122 Type 2 diabetes mellitus with diabetic chronic kidney disease: Secondary | ICD-10-CM | POA: Diagnosis not present

## 2023-06-23 DIAGNOSIS — M4802 Spinal stenosis, cervical region: Secondary | ICD-10-CM | POA: Diagnosis not present

## 2023-06-23 DIAGNOSIS — Z79891 Long term (current) use of opiate analgesic: Secondary | ICD-10-CM | POA: Diagnosis not present

## 2023-06-23 DIAGNOSIS — M5000 Cervical disc disorder with myelopathy, unspecified cervical region: Secondary | ICD-10-CM | POA: Diagnosis not present

## 2023-06-23 DIAGNOSIS — G8929 Other chronic pain: Secondary | ICD-10-CM | POA: Diagnosis not present

## 2023-06-23 DIAGNOSIS — E039 Hypothyroidism, unspecified: Secondary | ICD-10-CM | POA: Diagnosis not present

## 2023-06-23 DIAGNOSIS — Z86718 Personal history of other venous thrombosis and embolism: Secondary | ICD-10-CM | POA: Diagnosis not present

## 2023-06-23 DIAGNOSIS — G4700 Insomnia, unspecified: Secondary | ICD-10-CM | POA: Diagnosis not present

## 2023-06-23 DIAGNOSIS — Z993 Dependence on wheelchair: Secondary | ICD-10-CM | POA: Diagnosis not present

## 2023-06-25 DIAGNOSIS — Z853 Personal history of malignant neoplasm of breast: Secondary | ICD-10-CM | POA: Diagnosis not present

## 2023-06-25 DIAGNOSIS — E1122 Type 2 diabetes mellitus with diabetic chronic kidney disease: Secondary | ICD-10-CM | POA: Diagnosis not present

## 2023-06-25 DIAGNOSIS — M5412 Radiculopathy, cervical region: Secondary | ICD-10-CM | POA: Diagnosis not present

## 2023-06-25 DIAGNOSIS — Z4789 Encounter for other orthopedic aftercare: Secondary | ICD-10-CM | POA: Diagnosis not present

## 2023-06-25 DIAGNOSIS — S92352D Displaced fracture of fifth metatarsal bone, left foot, subsequent encounter for fracture with routine healing: Secondary | ICD-10-CM | POA: Diagnosis not present

## 2023-06-25 DIAGNOSIS — Z9181 History of falling: Secondary | ICD-10-CM | POA: Diagnosis not present

## 2023-06-25 DIAGNOSIS — G8929 Other chronic pain: Secondary | ICD-10-CM | POA: Diagnosis not present

## 2023-06-25 DIAGNOSIS — M4802 Spinal stenosis, cervical region: Secondary | ICD-10-CM | POA: Diagnosis not present

## 2023-06-25 DIAGNOSIS — I872 Venous insufficiency (chronic) (peripheral): Secondary | ICD-10-CM | POA: Diagnosis not present

## 2023-06-25 DIAGNOSIS — M47817 Spondylosis without myelopathy or radiculopathy, lumbosacral region: Secondary | ICD-10-CM | POA: Diagnosis not present

## 2023-06-25 DIAGNOSIS — M1990 Unspecified osteoarthritis, unspecified site: Secondary | ICD-10-CM | POA: Diagnosis not present

## 2023-06-25 DIAGNOSIS — Z993 Dependence on wheelchair: Secondary | ICD-10-CM | POA: Diagnosis not present

## 2023-06-25 DIAGNOSIS — Z556 Problems related to health literacy: Secondary | ICD-10-CM | POA: Diagnosis not present

## 2023-06-25 DIAGNOSIS — Z87440 Personal history of urinary (tract) infections: Secondary | ICD-10-CM | POA: Diagnosis not present

## 2023-06-25 DIAGNOSIS — I129 Hypertensive chronic kidney disease with stage 1 through stage 4 chronic kidney disease, or unspecified chronic kidney disease: Secondary | ICD-10-CM | POA: Diagnosis not present

## 2023-06-25 DIAGNOSIS — Z86718 Personal history of other venous thrombosis and embolism: Secondary | ICD-10-CM | POA: Diagnosis not present

## 2023-06-25 DIAGNOSIS — M5000 Cervical disc disorder with myelopathy, unspecified cervical region: Secondary | ICD-10-CM | POA: Diagnosis not present

## 2023-06-25 DIAGNOSIS — E039 Hypothyroidism, unspecified: Secondary | ICD-10-CM | POA: Diagnosis not present

## 2023-06-25 DIAGNOSIS — G4700 Insomnia, unspecified: Secondary | ICD-10-CM | POA: Diagnosis not present

## 2023-06-25 DIAGNOSIS — Z79891 Long term (current) use of opiate analgesic: Secondary | ICD-10-CM | POA: Diagnosis not present

## 2023-06-25 DIAGNOSIS — Z7984 Long term (current) use of oral hypoglycemic drugs: Secondary | ICD-10-CM | POA: Diagnosis not present

## 2023-06-25 DIAGNOSIS — Z981 Arthrodesis status: Secondary | ICD-10-CM | POA: Diagnosis not present

## 2023-06-25 DIAGNOSIS — R140 Abdominal distension (gaseous): Secondary | ICD-10-CM | POA: Diagnosis not present

## 2023-06-25 DIAGNOSIS — N181 Chronic kidney disease, stage 1: Secondary | ICD-10-CM | POA: Diagnosis not present

## 2023-06-28 DIAGNOSIS — R140 Abdominal distension (gaseous): Secondary | ICD-10-CM | POA: Diagnosis not present

## 2023-06-28 DIAGNOSIS — R0602 Shortness of breath: Secondary | ICD-10-CM | POA: Diagnosis not present

## 2023-06-28 DIAGNOSIS — N390 Urinary tract infection, site not specified: Secondary | ICD-10-CM | POA: Diagnosis not present

## 2023-06-29 DIAGNOSIS — R0600 Dyspnea, unspecified: Secondary | ICD-10-CM | POA: Diagnosis not present

## 2023-06-30 DIAGNOSIS — Z556 Problems related to health literacy: Secondary | ICD-10-CM | POA: Diagnosis not present

## 2023-06-30 DIAGNOSIS — Z4789 Encounter for other orthopedic aftercare: Secondary | ICD-10-CM | POA: Diagnosis not present

## 2023-06-30 DIAGNOSIS — Z86718 Personal history of other venous thrombosis and embolism: Secondary | ICD-10-CM | POA: Diagnosis not present

## 2023-06-30 DIAGNOSIS — S92352D Displaced fracture of fifth metatarsal bone, left foot, subsequent encounter for fracture with routine healing: Secondary | ICD-10-CM | POA: Diagnosis not present

## 2023-06-30 DIAGNOSIS — I872 Venous insufficiency (chronic) (peripheral): Secondary | ICD-10-CM | POA: Diagnosis not present

## 2023-06-30 DIAGNOSIS — E1122 Type 2 diabetes mellitus with diabetic chronic kidney disease: Secondary | ICD-10-CM | POA: Diagnosis not present

## 2023-06-30 DIAGNOSIS — Z853 Personal history of malignant neoplasm of breast: Secondary | ICD-10-CM | POA: Diagnosis not present

## 2023-06-30 DIAGNOSIS — M1990 Unspecified osteoarthritis, unspecified site: Secondary | ICD-10-CM | POA: Diagnosis not present

## 2023-06-30 DIAGNOSIS — M5000 Cervical disc disorder with myelopathy, unspecified cervical region: Secondary | ICD-10-CM | POA: Diagnosis not present

## 2023-06-30 DIAGNOSIS — G4700 Insomnia, unspecified: Secondary | ICD-10-CM | POA: Diagnosis not present

## 2023-06-30 DIAGNOSIS — G8929 Other chronic pain: Secondary | ICD-10-CM | POA: Diagnosis not present

## 2023-06-30 DIAGNOSIS — M4802 Spinal stenosis, cervical region: Secondary | ICD-10-CM | POA: Diagnosis not present

## 2023-06-30 DIAGNOSIS — Z981 Arthrodesis status: Secondary | ICD-10-CM | POA: Diagnosis not present

## 2023-06-30 DIAGNOSIS — I129 Hypertensive chronic kidney disease with stage 1 through stage 4 chronic kidney disease, or unspecified chronic kidney disease: Secondary | ICD-10-CM | POA: Diagnosis not present

## 2023-06-30 DIAGNOSIS — M47817 Spondylosis without myelopathy or radiculopathy, lumbosacral region: Secondary | ICD-10-CM | POA: Diagnosis not present

## 2023-06-30 DIAGNOSIS — E039 Hypothyroidism, unspecified: Secondary | ICD-10-CM | POA: Diagnosis not present

## 2023-06-30 DIAGNOSIS — N181 Chronic kidney disease, stage 1: Secondary | ICD-10-CM | POA: Diagnosis not present

## 2023-06-30 DIAGNOSIS — Z9181 History of falling: Secondary | ICD-10-CM | POA: Diagnosis not present

## 2023-06-30 DIAGNOSIS — Z7984 Long term (current) use of oral hypoglycemic drugs: Secondary | ICD-10-CM | POA: Diagnosis not present

## 2023-06-30 DIAGNOSIS — Z79891 Long term (current) use of opiate analgesic: Secondary | ICD-10-CM | POA: Diagnosis not present

## 2023-06-30 DIAGNOSIS — Z87440 Personal history of urinary (tract) infections: Secondary | ICD-10-CM | POA: Diagnosis not present

## 2023-06-30 DIAGNOSIS — M5412 Radiculopathy, cervical region: Secondary | ICD-10-CM | POA: Diagnosis not present

## 2023-06-30 DIAGNOSIS — Z993 Dependence on wheelchair: Secondary | ICD-10-CM | POA: Diagnosis not present

## 2023-07-01 DIAGNOSIS — Z7189 Other specified counseling: Secondary | ICD-10-CM | POA: Diagnosis not present

## 2023-07-01 DIAGNOSIS — J449 Chronic obstructive pulmonary disease, unspecified: Secondary | ICD-10-CM | POA: Diagnosis not present

## 2023-07-01 DIAGNOSIS — Z713 Dietary counseling and surveillance: Secondary | ICD-10-CM | POA: Diagnosis not present

## 2023-07-01 DIAGNOSIS — Z299 Encounter for prophylactic measures, unspecified: Secondary | ICD-10-CM | POA: Diagnosis not present

## 2023-07-01 DIAGNOSIS — Z0000 Encounter for general adult medical examination without abnormal findings: Secondary | ICD-10-CM | POA: Diagnosis not present

## 2023-07-03 DIAGNOSIS — Z981 Arthrodesis status: Secondary | ICD-10-CM | POA: Diagnosis not present

## 2023-07-03 DIAGNOSIS — M2578 Osteophyte, vertebrae: Secondary | ICD-10-CM | POA: Diagnosis not present

## 2023-07-03 DIAGNOSIS — G959 Disease of spinal cord, unspecified: Secondary | ICD-10-CM | POA: Diagnosis not present

## 2023-07-03 DIAGNOSIS — M4712 Other spondylosis with myelopathy, cervical region: Secondary | ICD-10-CM | POA: Diagnosis not present

## 2023-07-03 DIAGNOSIS — M4802 Spinal stenosis, cervical region: Secondary | ICD-10-CM | POA: Diagnosis not present

## 2023-07-04 DIAGNOSIS — Z981 Arthrodesis status: Secondary | ICD-10-CM | POA: Diagnosis not present

## 2023-07-14 DIAGNOSIS — R140 Abdominal distension (gaseous): Secondary | ICD-10-CM | POA: Diagnosis not present

## 2023-07-14 DIAGNOSIS — N3000 Acute cystitis without hematuria: Secondary | ICD-10-CM | POA: Diagnosis not present

## 2023-07-14 DIAGNOSIS — J432 Centrilobular emphysema: Secondary | ICD-10-CM | POA: Diagnosis not present

## 2023-07-14 DIAGNOSIS — E1122 Type 2 diabetes mellitus with diabetic chronic kidney disease: Secondary | ICD-10-CM | POA: Diagnosis not present

## 2023-07-14 DIAGNOSIS — I129 Hypertensive chronic kidney disease with stage 1 through stage 4 chronic kidney disease, or unspecified chronic kidney disease: Secondary | ICD-10-CM | POA: Diagnosis not present

## 2023-07-14 DIAGNOSIS — E039 Hypothyroidism, unspecified: Secondary | ICD-10-CM | POA: Diagnosis not present

## 2023-07-14 DIAGNOSIS — R918 Other nonspecific abnormal finding of lung field: Secondary | ICD-10-CM | POA: Diagnosis not present

## 2023-07-14 DIAGNOSIS — G894 Chronic pain syndrome: Secondary | ICD-10-CM | POA: Diagnosis not present

## 2023-07-14 DIAGNOSIS — M4802 Spinal stenosis, cervical region: Secondary | ICD-10-CM | POA: Diagnosis not present

## 2023-07-14 DIAGNOSIS — N182 Chronic kidney disease, stage 2 (mild): Secondary | ICD-10-CM | POA: Diagnosis not present

## 2023-07-14 DIAGNOSIS — Z7409 Other reduced mobility: Secondary | ICD-10-CM | POA: Diagnosis not present

## 2023-07-24 DIAGNOSIS — G8929 Other chronic pain: Secondary | ICD-10-CM | POA: Diagnosis not present

## 2023-07-24 DIAGNOSIS — M542 Cervicalgia: Secondary | ICD-10-CM | POA: Diagnosis not present

## 2023-07-24 DIAGNOSIS — Z981 Arthrodesis status: Secondary | ICD-10-CM | POA: Diagnosis not present

## 2023-07-24 DIAGNOSIS — M4802 Spinal stenosis, cervical region: Secondary | ICD-10-CM | POA: Diagnosis not present

## 2023-07-31 DIAGNOSIS — K118 Other diseases of salivary glands: Secondary | ICD-10-CM | POA: Diagnosis not present

## 2023-08-03 DIAGNOSIS — Z981 Arthrodesis status: Secondary | ICD-10-CM | POA: Diagnosis not present

## 2023-08-04 DIAGNOSIS — R0602 Shortness of breath: Secondary | ICD-10-CM | POA: Diagnosis not present

## 2023-08-04 DIAGNOSIS — R918 Other nonspecific abnormal finding of lung field: Secondary | ICD-10-CM | POA: Diagnosis not present

## 2023-08-05 DIAGNOSIS — M4322 Fusion of spine, cervical region: Secondary | ICD-10-CM | POA: Diagnosis not present

## 2023-08-05 DIAGNOSIS — R918 Other nonspecific abnormal finding of lung field: Secondary | ICD-10-CM | POA: Diagnosis not present

## 2023-08-05 DIAGNOSIS — K118 Other diseases of salivary glands: Secondary | ICD-10-CM | POA: Diagnosis not present

## 2023-08-05 DIAGNOSIS — R221 Localized swelling, mass and lump, neck: Secondary | ICD-10-CM | POA: Diagnosis not present

## 2023-08-15 DIAGNOSIS — Z79899 Other long term (current) drug therapy: Secondary | ICD-10-CM | POA: Diagnosis not present

## 2023-08-15 DIAGNOSIS — R109 Unspecified abdominal pain: Secondary | ICD-10-CM | POA: Diagnosis not present

## 2023-08-15 DIAGNOSIS — R0602 Shortness of breath: Secondary | ICD-10-CM | POA: Diagnosis not present

## 2023-08-15 DIAGNOSIS — K5730 Diverticulosis of large intestine without perforation or abscess without bleeding: Secondary | ICD-10-CM | POA: Diagnosis not present

## 2023-08-15 DIAGNOSIS — I10 Essential (primary) hypertension: Secondary | ICD-10-CM | POA: Diagnosis not present

## 2023-08-15 DIAGNOSIS — R918 Other nonspecific abnormal finding of lung field: Secondary | ICD-10-CM | POA: Diagnosis not present

## 2023-08-15 DIAGNOSIS — Z9049 Acquired absence of other specified parts of digestive tract: Secondary | ICD-10-CM | POA: Diagnosis not present

## 2023-08-15 DIAGNOSIS — E010 Iodine-deficiency related diffuse (endemic) goiter: Secondary | ICD-10-CM | POA: Diagnosis not present

## 2023-08-15 DIAGNOSIS — R9431 Abnormal electrocardiogram [ECG] [EKG]: Secondary | ICD-10-CM | POA: Diagnosis not present

## 2023-08-15 DIAGNOSIS — J9811 Atelectasis: Secondary | ICD-10-CM | POA: Diagnosis not present

## 2023-08-15 DIAGNOSIS — N390 Urinary tract infection, site not specified: Secondary | ICD-10-CM | POA: Diagnosis not present

## 2023-08-15 DIAGNOSIS — R140 Abdominal distension (gaseous): Secondary | ICD-10-CM | POA: Diagnosis not present

## 2023-08-15 DIAGNOSIS — K439 Ventral hernia without obstruction or gangrene: Secondary | ICD-10-CM | POA: Diagnosis not present

## 2023-08-15 DIAGNOSIS — R935 Abnormal findings on diagnostic imaging of other abdominal regions, including retroperitoneum: Secondary | ICD-10-CM | POA: Diagnosis not present

## 2023-08-15 DIAGNOSIS — K469 Unspecified abdominal hernia without obstruction or gangrene: Secondary | ICD-10-CM | POA: Diagnosis not present

## 2023-08-18 DIAGNOSIS — R0600 Dyspnea, unspecified: Secondary | ICD-10-CM | POA: Diagnosis not present

## 2023-08-18 DIAGNOSIS — K118 Other diseases of salivary glands: Secondary | ICD-10-CM | POA: Diagnosis not present

## 2023-08-18 DIAGNOSIS — R140 Abdominal distension (gaseous): Secondary | ICD-10-CM | POA: Diagnosis not present

## 2023-08-22 DIAGNOSIS — J432 Centrilobular emphysema: Secondary | ICD-10-CM | POA: Diagnosis not present

## 2023-08-26 DIAGNOSIS — G894 Chronic pain syndrome: Secondary | ICD-10-CM | POA: Diagnosis not present

## 2023-08-26 DIAGNOSIS — M542 Cervicalgia: Secondary | ICD-10-CM | POA: Diagnosis not present

## 2023-08-26 DIAGNOSIS — N181 Chronic kidney disease, stage 1: Secondary | ICD-10-CM | POA: Diagnosis not present

## 2023-08-26 DIAGNOSIS — E1122 Type 2 diabetes mellitus with diabetic chronic kidney disease: Secondary | ICD-10-CM | POA: Diagnosis not present

## 2023-08-26 DIAGNOSIS — M5412 Radiculopathy, cervical region: Secondary | ICD-10-CM | POA: Diagnosis not present

## 2023-08-26 DIAGNOSIS — G959 Disease of spinal cord, unspecified: Secondary | ICD-10-CM | POA: Diagnosis not present

## 2023-09-03 DIAGNOSIS — Z981 Arthrodesis status: Secondary | ICD-10-CM | POA: Diagnosis not present

## 2023-09-10 DIAGNOSIS — K118 Other diseases of salivary glands: Secondary | ICD-10-CM | POA: Diagnosis not present

## 2023-09-25 DIAGNOSIS — J9811 Atelectasis: Secondary | ICD-10-CM | POA: Diagnosis not present

## 2023-09-25 DIAGNOSIS — R531 Weakness: Secondary | ICD-10-CM | POA: Diagnosis not present

## 2023-09-25 DIAGNOSIS — R109 Unspecified abdominal pain: Secondary | ICD-10-CM | POA: Diagnosis not present

## 2023-09-25 DIAGNOSIS — Z8673 Personal history of transient ischemic attack (TIA), and cerebral infarction without residual deficits: Secondary | ICD-10-CM | POA: Diagnosis not present

## 2023-09-25 DIAGNOSIS — R0602 Shortness of breath: Secondary | ICD-10-CM | POA: Diagnosis not present

## 2023-09-25 DIAGNOSIS — E1122 Type 2 diabetes mellitus with diabetic chronic kidney disease: Secondary | ICD-10-CM | POA: Diagnosis not present

## 2023-09-25 DIAGNOSIS — R9431 Abnormal electrocardiogram [ECG] [EKG]: Secondary | ICD-10-CM | POA: Diagnosis not present

## 2023-09-25 DIAGNOSIS — K5730 Diverticulosis of large intestine without perforation or abscess without bleeding: Secondary | ICD-10-CM | POA: Diagnosis not present

## 2023-09-25 DIAGNOSIS — Z884 Allergy status to anesthetic agent status: Secondary | ICD-10-CM | POA: Diagnosis not present

## 2023-09-25 DIAGNOSIS — E039 Hypothyroidism, unspecified: Secondary | ICD-10-CM | POA: Diagnosis not present

## 2023-09-25 DIAGNOSIS — N181 Chronic kidney disease, stage 1: Secondary | ICD-10-CM | POA: Diagnosis not present

## 2023-09-25 DIAGNOSIS — Z79899 Other long term (current) drug therapy: Secondary | ICD-10-CM | POA: Diagnosis not present

## 2023-09-25 DIAGNOSIS — Z886 Allergy status to analgesic agent status: Secondary | ICD-10-CM | POA: Diagnosis not present

## 2023-09-25 DIAGNOSIS — M8580 Other specified disorders of bone density and structure, unspecified site: Secondary | ICD-10-CM | POA: Diagnosis not present

## 2023-09-25 DIAGNOSIS — Z7984 Long term (current) use of oral hypoglycemic drugs: Secondary | ICD-10-CM | POA: Diagnosis not present

## 2023-09-25 DIAGNOSIS — R197 Diarrhea, unspecified: Secondary | ICD-10-CM | POA: Diagnosis not present

## 2023-09-25 DIAGNOSIS — Z883 Allergy status to other anti-infective agents status: Secondary | ICD-10-CM | POA: Diagnosis not present

## 2023-09-25 DIAGNOSIS — K469 Unspecified abdominal hernia without obstruction or gangrene: Secondary | ICD-10-CM | POA: Diagnosis not present

## 2023-09-25 DIAGNOSIS — Z853 Personal history of malignant neoplasm of breast: Secondary | ICD-10-CM | POA: Diagnosis not present

## 2023-09-25 DIAGNOSIS — R627 Adult failure to thrive: Secondary | ICD-10-CM | POA: Diagnosis not present

## 2023-09-25 DIAGNOSIS — M1990 Unspecified osteoarthritis, unspecified site: Secondary | ICD-10-CM | POA: Diagnosis not present

## 2023-09-25 DIAGNOSIS — N390 Urinary tract infection, site not specified: Secondary | ICD-10-CM | POA: Diagnosis not present

## 2023-09-25 DIAGNOSIS — Z7409 Other reduced mobility: Secondary | ICD-10-CM | POA: Diagnosis not present

## 2023-09-25 DIAGNOSIS — R918 Other nonspecific abnormal finding of lung field: Secondary | ICD-10-CM | POA: Diagnosis not present

## 2023-09-25 DIAGNOSIS — R1900 Intra-abdominal and pelvic swelling, mass and lump, unspecified site: Secondary | ICD-10-CM | POA: Diagnosis not present

## 2023-09-25 DIAGNOSIS — N3000 Acute cystitis without hematuria: Secondary | ICD-10-CM | POA: Diagnosis not present

## 2023-09-26 DIAGNOSIS — E1122 Type 2 diabetes mellitus with diabetic chronic kidney disease: Secondary | ICD-10-CM | POA: Diagnosis not present

## 2023-09-26 DIAGNOSIS — R197 Diarrhea, unspecified: Secondary | ICD-10-CM | POA: Diagnosis not present

## 2023-09-26 DIAGNOSIS — R531 Weakness: Secondary | ICD-10-CM | POA: Diagnosis not present

## 2023-09-27 DIAGNOSIS — R197 Diarrhea, unspecified: Secondary | ICD-10-CM | POA: Diagnosis not present

## 2023-09-27 DIAGNOSIS — N3000 Acute cystitis without hematuria: Secondary | ICD-10-CM | POA: Diagnosis not present

## 2023-09-27 DIAGNOSIS — E1122 Type 2 diabetes mellitus with diabetic chronic kidney disease: Secondary | ICD-10-CM | POA: Diagnosis not present

## 2023-09-27 DIAGNOSIS — R531 Weakness: Secondary | ICD-10-CM | POA: Diagnosis not present

## 2023-09-28 DIAGNOSIS — R531 Weakness: Secondary | ICD-10-CM | POA: Diagnosis not present

## 2023-09-28 DIAGNOSIS — R197 Diarrhea, unspecified: Secondary | ICD-10-CM | POA: Diagnosis not present

## 2023-09-28 DIAGNOSIS — E1122 Type 2 diabetes mellitus with diabetic chronic kidney disease: Secondary | ICD-10-CM | POA: Diagnosis not present

## 2023-09-29 DIAGNOSIS — N181 Chronic kidney disease, stage 1: Secondary | ICD-10-CM | POA: Diagnosis not present

## 2023-09-29 DIAGNOSIS — M549 Dorsalgia, unspecified: Secondary | ICD-10-CM | POA: Diagnosis not present

## 2023-09-29 DIAGNOSIS — Z7984 Long term (current) use of oral hypoglycemic drugs: Secondary | ICD-10-CM | POA: Diagnosis not present

## 2023-09-29 DIAGNOSIS — N3000 Acute cystitis without hematuria: Secondary | ICD-10-CM | POA: Diagnosis not present

## 2023-09-29 DIAGNOSIS — M6281 Muscle weakness (generalized): Secondary | ICD-10-CM | POA: Diagnosis not present

## 2023-09-29 DIAGNOSIS — J9811 Atelectasis: Secondary | ICD-10-CM | POA: Diagnosis not present

## 2023-09-29 DIAGNOSIS — Z7409 Other reduced mobility: Secondary | ICD-10-CM | POA: Diagnosis not present

## 2023-09-29 DIAGNOSIS — Z886 Allergy status to analgesic agent status: Secondary | ICD-10-CM | POA: Diagnosis not present

## 2023-09-29 DIAGNOSIS — J449 Chronic obstructive pulmonary disease, unspecified: Secondary | ICD-10-CM | POA: Diagnosis not present

## 2023-09-29 DIAGNOSIS — R197 Diarrhea, unspecified: Secondary | ICD-10-CM | POA: Diagnosis not present

## 2023-09-29 DIAGNOSIS — Z883 Allergy status to other anti-infective agents status: Secondary | ICD-10-CM | POA: Diagnosis not present

## 2023-09-29 DIAGNOSIS — K5730 Diverticulosis of large intestine without perforation or abscess without bleeding: Secondary | ICD-10-CM | POA: Diagnosis not present

## 2023-09-29 DIAGNOSIS — Z8673 Personal history of transient ischemic attack (TIA), and cerebral infarction without residual deficits: Secondary | ICD-10-CM | POA: Diagnosis not present

## 2023-09-29 DIAGNOSIS — R627 Adult failure to thrive: Secondary | ICD-10-CM | POA: Diagnosis not present

## 2023-09-29 DIAGNOSIS — M1990 Unspecified osteoarthritis, unspecified site: Secondary | ICD-10-CM | POA: Diagnosis not present

## 2023-09-29 DIAGNOSIS — E039 Hypothyroidism, unspecified: Secondary | ICD-10-CM | POA: Diagnosis not present

## 2023-09-29 DIAGNOSIS — M545 Low back pain, unspecified: Secondary | ICD-10-CM | POA: Diagnosis not present

## 2023-09-29 DIAGNOSIS — K469 Unspecified abdominal hernia without obstruction or gangrene: Secondary | ICD-10-CM | POA: Diagnosis not present

## 2023-09-29 DIAGNOSIS — M51369 Other intervertebral disc degeneration, lumbar region without mention of lumbar back pain or lower extremity pain: Secondary | ICD-10-CM | POA: Diagnosis not present

## 2023-09-29 DIAGNOSIS — Z853 Personal history of malignant neoplasm of breast: Secondary | ICD-10-CM | POA: Diagnosis not present

## 2023-09-29 DIAGNOSIS — G8929 Other chronic pain: Secondary | ICD-10-CM | POA: Diagnosis not present

## 2023-09-29 DIAGNOSIS — R531 Weakness: Secondary | ICD-10-CM | POA: Diagnosis not present

## 2023-09-29 DIAGNOSIS — M542 Cervicalgia: Secondary | ICD-10-CM | POA: Diagnosis not present

## 2023-09-29 DIAGNOSIS — R262 Difficulty in walking, not elsewhere classified: Secondary | ICD-10-CM | POA: Diagnosis not present

## 2023-09-29 DIAGNOSIS — R2681 Unsteadiness on feet: Secondary | ICD-10-CM | POA: Diagnosis not present

## 2023-09-29 DIAGNOSIS — Z981 Arthrodesis status: Secondary | ICD-10-CM | POA: Diagnosis not present

## 2023-09-29 DIAGNOSIS — Z884 Allergy status to anesthetic agent status: Secondary | ICD-10-CM | POA: Diagnosis not present

## 2023-09-29 DIAGNOSIS — Z79899 Other long term (current) drug therapy: Secondary | ICD-10-CM | POA: Diagnosis not present

## 2023-09-29 DIAGNOSIS — E1122 Type 2 diabetes mellitus with diabetic chronic kidney disease: Secondary | ICD-10-CM | POA: Diagnosis not present

## 2023-09-29 DIAGNOSIS — D8481 Immunodeficiency due to conditions classified elsewhere: Secondary | ICD-10-CM | POA: Diagnosis not present

## 2023-09-29 DIAGNOSIS — M8580 Other specified disorders of bone density and structure, unspecified site: Secondary | ICD-10-CM | POA: Diagnosis not present

## 2023-09-29 DIAGNOSIS — I10 Essential (primary) hypertension: Secondary | ICD-10-CM | POA: Diagnosis not present

## 2023-10-02 DIAGNOSIS — E1122 Type 2 diabetes mellitus with diabetic chronic kidney disease: Secondary | ICD-10-CM | POA: Diagnosis not present

## 2023-10-02 DIAGNOSIS — E039 Hypothyroidism, unspecified: Secondary | ICD-10-CM | POA: Diagnosis not present

## 2023-10-02 DIAGNOSIS — N181 Chronic kidney disease, stage 1: Secondary | ICD-10-CM | POA: Diagnosis not present

## 2023-10-02 DIAGNOSIS — M51369 Other intervertebral disc degeneration, lumbar region without mention of lumbar back pain or lower extremity pain: Secondary | ICD-10-CM | POA: Diagnosis not present

## 2023-10-02 DIAGNOSIS — J449 Chronic obstructive pulmonary disease, unspecified: Secondary | ICD-10-CM | POA: Diagnosis not present

## 2023-10-04 DIAGNOSIS — Z981 Arthrodesis status: Secondary | ICD-10-CM | POA: Diagnosis not present

## 2023-10-07 DIAGNOSIS — M542 Cervicalgia: Secondary | ICD-10-CM | POA: Diagnosis not present

## 2023-10-07 DIAGNOSIS — E1122 Type 2 diabetes mellitus with diabetic chronic kidney disease: Secondary | ICD-10-CM | POA: Diagnosis not present

## 2023-10-07 DIAGNOSIS — I10 Essential (primary) hypertension: Secondary | ICD-10-CM | POA: Diagnosis not present

## 2023-10-07 DIAGNOSIS — J449 Chronic obstructive pulmonary disease, unspecified: Secondary | ICD-10-CM | POA: Diagnosis not present

## 2023-10-07 DIAGNOSIS — N181 Chronic kidney disease, stage 1: Secondary | ICD-10-CM | POA: Diagnosis not present

## 2023-10-07 DIAGNOSIS — E039 Hypothyroidism, unspecified: Secondary | ICD-10-CM | POA: Diagnosis not present

## 2023-10-15 DIAGNOSIS — E119 Type 2 diabetes mellitus without complications: Secondary | ICD-10-CM | POA: Diagnosis not present

## 2023-10-15 DIAGNOSIS — E039 Hypothyroidism, unspecified: Secondary | ICD-10-CM | POA: Diagnosis not present

## 2023-10-15 DIAGNOSIS — Z7409 Other reduced mobility: Secondary | ICD-10-CM | POA: Diagnosis not present

## 2023-10-15 DIAGNOSIS — I10 Essential (primary) hypertension: Secondary | ICD-10-CM | POA: Diagnosis not present

## 2023-10-15 DIAGNOSIS — G894 Chronic pain syndrome: Secondary | ICD-10-CM | POA: Diagnosis not present

## 2023-10-26 DIAGNOSIS — I129 Hypertensive chronic kidney disease with stage 1 through stage 4 chronic kidney disease, or unspecified chronic kidney disease: Secondary | ICD-10-CM | POA: Diagnosis not present

## 2023-10-26 DIAGNOSIS — Z7951 Long term (current) use of inhaled steroids: Secondary | ICD-10-CM | POA: Diagnosis not present

## 2023-10-26 DIAGNOSIS — N390 Urinary tract infection, site not specified: Secondary | ICD-10-CM | POA: Diagnosis not present

## 2023-10-26 DIAGNOSIS — I10 Essential (primary) hypertension: Secondary | ICD-10-CM | POA: Diagnosis not present

## 2023-10-26 DIAGNOSIS — K219 Gastro-esophageal reflux disease without esophagitis: Secondary | ICD-10-CM | POA: Diagnosis not present

## 2023-10-26 DIAGNOSIS — R112 Nausea with vomiting, unspecified: Secondary | ICD-10-CM | POA: Diagnosis not present

## 2023-10-26 DIAGNOSIS — R0602 Shortness of breath: Secondary | ICD-10-CM | POA: Diagnosis not present

## 2023-10-26 DIAGNOSIS — R197 Diarrhea, unspecified: Secondary | ICD-10-CM | POA: Diagnosis not present

## 2023-10-26 DIAGNOSIS — E860 Dehydration: Secondary | ICD-10-CM | POA: Diagnosis not present

## 2023-10-26 DIAGNOSIS — E1142 Type 2 diabetes mellitus with diabetic polyneuropathy: Secondary | ICD-10-CM | POA: Diagnosis not present

## 2023-10-26 DIAGNOSIS — N181 Chronic kidney disease, stage 1: Secondary | ICD-10-CM | POA: Diagnosis not present

## 2023-10-26 DIAGNOSIS — M6281 Muscle weakness (generalized): Secondary | ICD-10-CM | POA: Diagnosis not present

## 2023-10-26 DIAGNOSIS — Z993 Dependence on wheelchair: Secondary | ICD-10-CM | POA: Diagnosis not present

## 2023-10-26 DIAGNOSIS — Z79899 Other long term (current) drug therapy: Secondary | ICD-10-CM | POA: Diagnosis not present

## 2023-10-26 DIAGNOSIS — J45909 Unspecified asthma, uncomplicated: Secondary | ICD-10-CM | POA: Diagnosis not present

## 2023-10-26 DIAGNOSIS — Z7984 Long term (current) use of oral hypoglycemic drugs: Secondary | ICD-10-CM | POA: Diagnosis not present

## 2023-10-26 DIAGNOSIS — E1122 Type 2 diabetes mellitus with diabetic chronic kidney disease: Secondary | ICD-10-CM | POA: Diagnosis not present

## 2023-10-26 DIAGNOSIS — E039 Hypothyroidism, unspecified: Secondary | ICD-10-CM | POA: Diagnosis not present

## 2023-10-26 DIAGNOSIS — N3000 Acute cystitis without hematuria: Secondary | ICD-10-CM | POA: Diagnosis not present

## 2023-10-27 DIAGNOSIS — I10 Essential (primary) hypertension: Secondary | ICD-10-CM | POA: Diagnosis not present

## 2023-10-27 DIAGNOSIS — R112 Nausea with vomiting, unspecified: Secondary | ICD-10-CM | POA: Diagnosis not present

## 2023-10-27 DIAGNOSIS — N3000 Acute cystitis without hematuria: Secondary | ICD-10-CM | POA: Diagnosis not present

## 2023-10-27 DIAGNOSIS — E039 Hypothyroidism, unspecified: Secondary | ICD-10-CM | POA: Diagnosis not present

## 2023-11-03 DIAGNOSIS — Z981 Arthrodesis status: Secondary | ICD-10-CM | POA: Diagnosis not present

## 2023-11-05 DIAGNOSIS — Z9889 Other specified postprocedural states: Secondary | ICD-10-CM | POA: Diagnosis not present

## 2023-11-05 DIAGNOSIS — M1990 Unspecified osteoarthritis, unspecified site: Secondary | ICD-10-CM | POA: Diagnosis not present

## 2023-11-05 DIAGNOSIS — E11622 Type 2 diabetes mellitus with other skin ulcer: Secondary | ICD-10-CM | POA: Diagnosis not present

## 2023-11-05 DIAGNOSIS — L03818 Cellulitis of other sites: Secondary | ICD-10-CM | POA: Diagnosis not present

## 2023-11-05 DIAGNOSIS — M7989 Other specified soft tissue disorders: Secondary | ICD-10-CM | POA: Diagnosis not present

## 2023-11-05 DIAGNOSIS — Z7984 Long term (current) use of oral hypoglycemic drugs: Secondary | ICD-10-CM | POA: Diagnosis not present

## 2023-11-05 DIAGNOSIS — R7982 Elevated C-reactive protein (CRP): Secondary | ICD-10-CM | POA: Diagnosis not present

## 2023-11-05 DIAGNOSIS — Z9049 Acquired absence of other specified parts of digestive tract: Secondary | ICD-10-CM | POA: Diagnosis not present

## 2023-11-05 DIAGNOSIS — R109 Unspecified abdominal pain: Secondary | ICD-10-CM | POA: Diagnosis not present

## 2023-11-05 DIAGNOSIS — J984 Other disorders of lung: Secondary | ICD-10-CM | POA: Diagnosis not present

## 2023-11-05 DIAGNOSIS — Z9011 Acquired absence of right breast and nipple: Secondary | ICD-10-CM | POA: Diagnosis not present

## 2023-11-05 DIAGNOSIS — L89211 Pressure ulcer of right hip, stage 1: Secondary | ICD-10-CM | POA: Diagnosis not present

## 2023-11-05 DIAGNOSIS — Z8673 Personal history of transient ischemic attack (TIA), and cerebral infarction without residual deficits: Secondary | ICD-10-CM | POA: Diagnosis not present

## 2023-11-05 DIAGNOSIS — L89151 Pressure ulcer of sacral region, stage 1: Secondary | ICD-10-CM | POA: Diagnosis not present

## 2023-11-05 DIAGNOSIS — Z79899 Other long term (current) drug therapy: Secondary | ICD-10-CM | POA: Diagnosis not present

## 2023-11-05 DIAGNOSIS — R269 Unspecified abnormalities of gait and mobility: Secondary | ICD-10-CM | POA: Diagnosis not present

## 2023-11-05 DIAGNOSIS — Z853 Personal history of malignant neoplasm of breast: Secondary | ICD-10-CM | POA: Diagnosis not present

## 2023-11-05 DIAGNOSIS — L89311 Pressure ulcer of right buttock, stage 1: Secondary | ICD-10-CM | POA: Diagnosis not present

## 2023-11-05 DIAGNOSIS — Z96651 Presence of right artificial knee joint: Secondary | ICD-10-CM | POA: Diagnosis not present

## 2023-11-05 DIAGNOSIS — E079 Disorder of thyroid, unspecified: Secondary | ICD-10-CM | POA: Diagnosis not present

## 2023-11-05 DIAGNOSIS — Z9089 Acquired absence of other organs: Secondary | ICD-10-CM | POA: Diagnosis not present

## 2023-11-05 DIAGNOSIS — J9811 Atelectasis: Secondary | ICD-10-CM | POA: Diagnosis not present

## 2023-11-10 DIAGNOSIS — G959 Disease of spinal cord, unspecified: Secondary | ICD-10-CM | POA: Diagnosis not present

## 2023-11-10 DIAGNOSIS — M5412 Radiculopathy, cervical region: Secondary | ICD-10-CM | POA: Diagnosis not present

## 2023-11-10 DIAGNOSIS — R531 Weakness: Secondary | ICD-10-CM | POA: Diagnosis not present

## 2023-11-10 DIAGNOSIS — I10 Essential (primary) hypertension: Secondary | ICD-10-CM | POA: Diagnosis not present

## 2023-11-10 DIAGNOSIS — E1122 Type 2 diabetes mellitus with diabetic chronic kidney disease: Secondary | ICD-10-CM | POA: Diagnosis not present

## 2023-11-10 DIAGNOSIS — L98429 Non-pressure chronic ulcer of back with unspecified severity: Secondary | ICD-10-CM | POA: Diagnosis not present

## 2023-11-10 DIAGNOSIS — B359 Dermatophytosis, unspecified: Secondary | ICD-10-CM | POA: Diagnosis not present

## 2023-11-10 DIAGNOSIS — N181 Chronic kidney disease, stage 1: Secondary | ICD-10-CM | POA: Diagnosis not present

## 2023-11-13 DIAGNOSIS — E039 Hypothyroidism, unspecified: Secondary | ICD-10-CM | POA: Diagnosis not present

## 2023-11-13 DIAGNOSIS — N181 Chronic kidney disease, stage 1: Secondary | ICD-10-CM | POA: Diagnosis not present

## 2023-11-13 DIAGNOSIS — I10 Essential (primary) hypertension: Secondary | ICD-10-CM | POA: Diagnosis not present

## 2023-11-13 DIAGNOSIS — E1122 Type 2 diabetes mellitus with diabetic chronic kidney disease: Secondary | ICD-10-CM | POA: Diagnosis not present

## 2023-11-13 DIAGNOSIS — L98429 Non-pressure chronic ulcer of back with unspecified severity: Secondary | ICD-10-CM | POA: Diagnosis not present

## 2023-11-13 DIAGNOSIS — J449 Chronic obstructive pulmonary disease, unspecified: Secondary | ICD-10-CM | POA: Diagnosis not present
